# Patient Record
Sex: Male | Born: 1955 | Race: White | Hispanic: No | Marital: Married | Smoking: Former smoker
Health system: Southern US, Community
[De-identification: ages and names within clinical notes are randomized; demographics above are authoritative.]

## PROBLEM LIST (undated history)

## (undated) DIAGNOSIS — A419 Sepsis, unspecified organism: Secondary | ICD-10-CM

## (undated) DIAGNOSIS — K750 Abscess of liver: Secondary | ICD-10-CM

## (undated) DIAGNOSIS — N179 Acute kidney failure, unspecified: Secondary | ICD-10-CM

## (undated) DIAGNOSIS — I82409 Acute embolism and thrombosis of unspecified deep veins of unspecified lower extremity: Secondary | ICD-10-CM

## (undated) DIAGNOSIS — I10 Essential (primary) hypertension: Secondary | ICD-10-CM

## (undated) DIAGNOSIS — I429 Cardiomyopathy, unspecified: Secondary | ICD-10-CM

## (undated) DIAGNOSIS — I3139 Other pericardial effusion (noninflammatory): Secondary | ICD-10-CM

## (undated) DIAGNOSIS — M069 Rheumatoid arthritis, unspecified: Secondary | ICD-10-CM

## (undated) DIAGNOSIS — K5792 Diverticulitis of intestine, part unspecified, without perforation or abscess without bleeding: Secondary | ICD-10-CM

## (undated) DIAGNOSIS — R6521 Severe sepsis with septic shock: Secondary | ICD-10-CM

## (undated) DIAGNOSIS — I313 Pericardial effusion (noninflammatory): Secondary | ICD-10-CM

## (undated) DIAGNOSIS — I472 Ventricular tachycardia: Secondary | ICD-10-CM

## (undated) DIAGNOSIS — E291 Testicular hypofunction: Secondary | ICD-10-CM

## (undated) HISTORY — DX: Rheumatoid arthritis, unspecified: M06.9

## (undated) HISTORY — DX: Other pericardial effusion (noninflammatory): I31.39

## (undated) HISTORY — PX: PERICARDIAL WINDOW: SHX2213

## (undated) HISTORY — DX: Pericardial effusion (noninflammatory): I31.3

## (undated) HISTORY — DX: Acute embolism and thrombosis of unspecified deep veins of unspecified lower extremity: I82.409

## (undated) HISTORY — DX: Diverticulitis of intestine, part unspecified, without perforation or abscess without bleeding: K57.92

## (undated) HISTORY — DX: Acute kidney failure, unspecified: N17.9

## (undated) HISTORY — DX: Cardiomyopathy, unspecified: I42.9

## (undated) HISTORY — DX: Sepsis, unspecified organism: A41.9

## (undated) HISTORY — DX: Ventricular tachycardia: I47.2

## (undated) HISTORY — DX: Essential (primary) hypertension: I10

## (undated) HISTORY — DX: Abscess of liver: K75.0

## (undated) HISTORY — PX: CHOLECYSTECTOMY: SHX55

## (undated) HISTORY — DX: Testicular hypofunction: E29.1

## (undated) HISTORY — DX: Severe sepsis with septic shock: R65.21

## (undated) HISTORY — PX: KNEE SURGERY: SHX244

---

## 2007-10-01 DIAGNOSIS — A419 Sepsis, unspecified organism: Secondary | ICD-10-CM

## 2007-10-01 HISTORY — DX: Sepsis, unspecified organism: A41.9

## 2007-12-12 ENCOUNTER — Ambulatory Visit: Payer: Self-pay | Admitting: Pulmonary Disease

## 2007-12-12 ENCOUNTER — Ambulatory Visit: Payer: Self-pay | Admitting: *Deleted

## 2007-12-12 ENCOUNTER — Inpatient Hospital Stay (HOSPITAL_COMMUNITY): Admission: EM | Admit: 2007-12-12 | Discharge: 2007-12-22 | Payer: Self-pay | Admitting: Internal Medicine

## 2007-12-14 ENCOUNTER — Encounter: Payer: Self-pay | Admitting: Pulmonary Disease

## 2007-12-17 ENCOUNTER — Ambulatory Visit: Payer: Self-pay | Admitting: Infectious Diseases

## 2007-12-18 ENCOUNTER — Ambulatory Visit: Payer: Self-pay | Admitting: Vascular Surgery

## 2007-12-18 ENCOUNTER — Encounter: Payer: Self-pay | Admitting: Pulmonary Disease

## 2007-12-19 ENCOUNTER — Encounter: Payer: Self-pay | Admitting: Pulmonary Disease

## 2007-12-21 ENCOUNTER — Encounter (INDEPENDENT_AMBULATORY_CARE_PROVIDER_SITE_OTHER): Payer: Self-pay | Admitting: Gastroenterology

## 2007-12-23 ENCOUNTER — Encounter (INDEPENDENT_AMBULATORY_CARE_PROVIDER_SITE_OTHER): Payer: Self-pay | Admitting: Internal Medicine

## 2007-12-24 ENCOUNTER — Telehealth (INDEPENDENT_AMBULATORY_CARE_PROVIDER_SITE_OTHER): Payer: Self-pay | Admitting: *Deleted

## 2007-12-30 ENCOUNTER — Encounter: Admission: RE | Admit: 2007-12-30 | Discharge: 2007-12-30 | Payer: Self-pay | Admitting: Interventional Radiology

## 2008-01-06 ENCOUNTER — Encounter: Admission: RE | Admit: 2008-01-06 | Discharge: 2008-01-06 | Payer: Self-pay | Admitting: Interventional Radiology

## 2008-01-11 ENCOUNTER — Encounter: Payer: Self-pay | Admitting: Adult Health

## 2008-01-12 ENCOUNTER — Telehealth (INDEPENDENT_AMBULATORY_CARE_PROVIDER_SITE_OTHER): Payer: Self-pay | Admitting: *Deleted

## 2008-01-19 ENCOUNTER — Ambulatory Visit: Payer: Self-pay | Admitting: Internal Medicine

## 2008-01-19 DIAGNOSIS — M069 Rheumatoid arthritis, unspecified: Secondary | ICD-10-CM | POA: Insufficient documentation

## 2008-01-19 DIAGNOSIS — M129 Arthropathy, unspecified: Secondary | ICD-10-CM | POA: Insufficient documentation

## 2008-01-19 DIAGNOSIS — K750 Abscess of liver: Secondary | ICD-10-CM | POA: Insufficient documentation

## 2008-01-19 DIAGNOSIS — I429 Cardiomyopathy, unspecified: Secondary | ICD-10-CM

## 2008-01-19 DIAGNOSIS — R578 Other shock: Secondary | ICD-10-CM | POA: Insufficient documentation

## 2008-01-19 DIAGNOSIS — I3139 Other pericardial effusion (noninflammatory): Secondary | ICD-10-CM | POA: Insufficient documentation

## 2008-01-19 DIAGNOSIS — N179 Acute kidney failure, unspecified: Secondary | ICD-10-CM

## 2008-01-19 DIAGNOSIS — I313 Pericardial effusion (noninflammatory): Secondary | ICD-10-CM | POA: Insufficient documentation

## 2008-01-19 HISTORY — DX: Cardiomyopathy, unspecified: I42.9

## 2008-02-08 ENCOUNTER — Ambulatory Visit: Payer: Self-pay | Admitting: Surgery

## 2008-03-07 ENCOUNTER — Ambulatory Visit: Payer: Self-pay | Admitting: Pulmonary Disease

## 2008-03-07 DIAGNOSIS — I80299 Phlebitis and thrombophlebitis of other deep vessels of unspecified lower extremity: Secondary | ICD-10-CM

## 2008-03-14 ENCOUNTER — Ambulatory Visit: Payer: Self-pay | Admitting: Surgery

## 2008-04-07 ENCOUNTER — Encounter: Payer: Self-pay | Admitting: Internal Medicine

## 2008-09-25 IMAGING — RF DG SINUS / FISTULA TRACT / ABSCESSOGRAM
1 series · 1 of 1 positions shown · non-contrast
Comparison: 12/21/2007

CLINICAL DATA: SINUS TRACT INJECTION/FISTULOGRAM
TECHNIQUE: Contrast was injected into the hepatic abscess drain.
Images were obtained.. A total of fivecc of Vmnipaque-[id number]
slowly injected. Spot film images were obtained.

[Series 1: run · 1 of 1 slices shown]
[im 1/1]
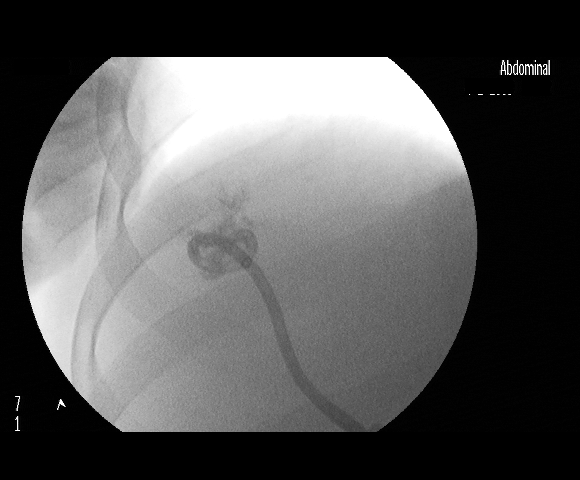

[1 of 1 positions shown; findings below may reference images not displayed]

FINDINGS: The abscess cavity has significantly improved.  A small
cavity remains.  There is no evidence of contrast filling the
biliary tree.

Fluoroscopy Time: 0.5 minutes
IMPRESSION: Improved abscess in the liver.  The patient was instructed to
continue flush injections and to follow-up and 1 week for possible
removal.

## 2010-04-19 ENCOUNTER — Telehealth (INDEPENDENT_AMBULATORY_CARE_PROVIDER_SITE_OTHER): Payer: Self-pay | Admitting: *Deleted

## 2010-04-19 ENCOUNTER — Encounter: Payer: Self-pay | Admitting: Pulmonary Disease

## 2010-10-30 NOTE — Progress Notes (Signed)
Summary: talk to nurse  Phone Note From Other Clinic Call back at [phone]   Caller: [facility] Call For: parrett Summary of Call: Need to speak to nurse in ref to treatment in 2009. Initial call taken by: Darletta Moll,  April 19, 2010 11:01 AM  Follow-up for Phone Call        called spoke with [facility].  she states that they received orders from our office on patient in 2009, but they have yet to be signed to pay for services rendered in 2009 - note: these are not new orders.  she states that the orders have been sent to Dr. Gwendolyn Grant attn, for which i informed her that he is a hosp doc, therefore does not sign orders.  lucky will send orders to triage fax # to be signed by RA.  fax order back [phone] attn [facility] Follow-up by: Boone Master CNA/MA,  April 19, 2010 11:33 AM  Additional Follow-up for Phone Call Additional follow up Details #1::        fax received.  handed to to [facility] for RA to sign.   Boone Master CNA/MA  April 19, 2010 11:43 AM   orders signed by RA and faxed back to [facility] to [facility]'s attn.  placed in RA's scan folder to be scanned into EMR. Boone Master CNA/MA  April 19, 2010 11:57 AM

## 2010-10-30 NOTE — Miscellaneous (Signed)
Summary: Orders/[facility]  Orders/[facility]   Imported By: Lester Blanchard 04/23/2010 07:57:22  _____________________________________________________________________  External Attachment:    Type:   Image     Comment:   External Document

## 2011-02-12 NOTE — Consult Note (Signed)
Harry Marsh, Harry Marsh NO.:  [id number]   MEDICAL RECORD NO.:  [id number]          PATIENT TYPE:  INP   LOCATION:  2109                         FACILITY:  [facility]   PHYSICIAN:  Rod Holler, MD     DATE OF BIRTH:  June 30, 1956   DATE OF CONSULTATION:  12/12/2007  DATE OF DISCHARGE:                                 CONSULTATION   REFERRING PHYSICIAN:  Larina Earthly, MD.   REASON FOR CONSULTATION:  History of cardiac tamponade status post  pericardial window.   HISTORY:  Mr. Harry Marsh is a pleasant 55 year old male with a history of  rheumatoid arthritis on chronic steroids, history of pericardial  effusion and tamponade status post pericardial window 10 years ago who  presented to an outside hospital with complaints of flu-like symptoms.  Over the past couple days, the patient has had flu-like symptoms, chills  and rigors, nausea and vomiting over the past couple days.  He also has  complaints of a cough productive of white sputum.  He also complains of  shortness of breath but has had no chest pain.  His wife has also been  sick at home.   PAST MEDICAL HISTORY:  1. History of pericardial effusion and tamponade status post      pericardial window.  2. Arthritis, on chronic steroids.  3. Status post cholecystectomy.   HOME MEDICINES:  1. Methotrexate.  2. Prednisone.  3. Hydroxychloroquine.  4. Naprosyn.   ALLERGIES:  No known drug allergies.   SOCIAL HISTORY:  The patient is married and denies tobacco use.   FAMILY HISTORY:  Noncontributory.   REVIEW OF SYSTEMS:  All systems are reviewed and are negative except  noted in the History of Present Illness.   PHYSICAL EXAM:  VITAL SIGNS:  Temperature 98.4, heart rate in the one-  teens, blood pressure in the 90s/60s.  GENERAL:  An obese male, alert and oriented x3, diaphoretic, ill-  appearing.  HEENT:  Atraumatic, normocephalic, pupils equal, round and react to  light, extraocular movements intact.  NECK:   Supple, right IJ central line in place, thick neck, no JVD noted  on the left side.  CHEST:  Lungs are clear to auscultation bilaterally with equal bilateral  breath sounds anteriorly.  CORONARY:  Tachycardic, regular, no murmurs, rubs or gallops.  ABDOMEN:  Soft, diffusely tender to palpation, no rebound or guarding.  EXTREMITIES:  No clubbing, cyanosis or edema, warm to the touch.  NEUROLOGIC:  No focal deficits.   Electrocardiogram shows sinus tachycardia, PVC, PAC, no ischemic  changes, poor quality tracing.   LABS:  Sodium 142, potassium 3.3, chloride 103, bicarb 21, BUN 41,  creatinine 2.6, glucose 62, white blood cell count 3.9, hematocrit 47,  platelet count 67,000, INR 1.8.  Albumin 2.8, alk phos 284, amylase 26,  lipase 25, PTT 33, BNP 203, calcium 8.7, CK of 48, CK MB of 1.2,  troponin 0.14.  SGOT 67, SGPT 43, total bilirubin 3.5.   IMPRESSION:  Mr. Harry Marsh is a 55 year old male with a history of  pericardial effusion and tamponade status post pericardial window,  arthritis on  chronic steroids who presents from an outside hospital with  a viral syndrome.  The patient is critically ill, not now hypotensive on  pressors.  Lab values are notable for acute renal failure, platelet  count of 67,000, INR 1.8, elevated total bilirubin.  Also, the patient  has a BNP of 203.  Bedside echocardiogram performed by me showed global  hypokinesis, at least moderately and probably severe reduced ejection  fraction.  There was no significant pericardial effusion.   RECOMMENDATIONS:  1. Formal echocardiogram.  2. Daily electrocardiogram.  3. Agree with current hemodynamic support and treatment of systemic      inflammatory response syndrome.  4. Serial cardiac enzymes.  5. Will continue to follow.      Rod Holler, MD  Electronically Signed     TRK/MEDQ  D:  12/12/2007  T:  12/13/2007  Job:  [id number]

## 2011-02-12 NOTE — Procedures (Signed)
DUPLEX DEEP VENOUS EXAM - LOWER EXTREMITY   INDICATION:  Left leg edema.   HISTORY:  Edema:  Left leg when standing for long periods of time.  Trauma/Surgery:  Patient has pins in the left leg from surgery which was  performed in either 1995 or 1996, patient is unclear which.  Pain:  Bilateral leg pain.  PE:  No.  Previous DVT:  Patient had a right leg gastrocnemius thrombus according  to a limited duplex study performed at [facility] on December 21, 2007.  Anticoagulants:  Other:  On March 14th, the left great toe became ischemic.   DUPLEX EXAM:                CFV   SFV   PopV  PTV    GSV                R  L  R  L  R  L  R   L  R  L  Thrombosis    0  0  0  P  0  P  0   0  0  0  Spontaneous   +  +  +  +  +  +  +   +  +  +  Phasic        +  +  +  +  +  +  +   +  +  +  Augmentation  +  +  +  +  +  +  +   +  +  +  Compressible  +  +  +  P  +  P  +   +  +  +  Competent     +  0  +  0  +  0  +   +  +  0   Legend:  + - yes  o - no  p - partial  D - decreased   IMPRESSION:  1. No evidence of right leg deep or superficial vein thrombus or      Baker's cyst.  2. Subacute thrombus is seen extending from the distal third of the      left superficial femoral vein through the left popliteal, left      tibioperoneal trunk and terminating in the gastrocnemius vein.      This thrombus does not completely occlude the veins.  3. The left common femoral vein, superficial femoral vein and      popliteal veins are incompetent.    _____________________________  V. Charlena Cross, MD   MC/MEDQ  D:  02/08/2008  T:  02/08/2008  Job:  [id number]

## 2011-02-12 NOTE — Discharge Summary (Signed)
Harry Marsh, Harry Marsh NO.:  [id number]   MEDICAL RECORD NO.:  [id number]          PATIENT TYPE:  INP   LOCATION:  3004                         FACILITY:  [facility]   PHYSICIAN:  Harry Bucks, MD DATE OF BIRTH:  04-01-1956   DATE OF ADMISSION:  12/12/2007  DATE OF DISCHARGE:  12/22/2007                               DISCHARGE SUMMARY   FINAL DIAGNOSES:  1. Liver abscess.  2. Resolving acute renal failure secondary to acute tubular necrosis.  3. Cardiomyopathy.  4. Resolved septic shock.  5. Rheumatoid arthritis.   CONSULTING PHYSICIANS:  1. Harry Marsh. Harry Marsh, M.D. with Infectious Disease.  2. Harry Marsh, M.D. with Interventional Radiology.  3. Harry Marsh, M.D. with [facility] Cardiology.   PROCEDURES:  1. Right internal jugular vein triple lumen catheter placed on December 12, 2007, removed on December 21, 2007.  2. Right upper extremity PICC line placed on December 21, 2007 for the      purpose of IV antibiotic infusion.   LABORATORY DATA:  Microbiology.  Anaerobic culture obtained from liver  abscess on December 13, 2007, demonstrating abundant Bacteroides vulgatus  this was beta-lactamase positive.  Blood cultures on December 12, 2007  negative.  Blood cultures on December 17, 2007 negative preliminary to  date.  Abscess culture on December 13, 2007 demonstrating few  microaerophilic streptococci.   BRIEF HISTORY:  This is a 55 year old male patient with a history of  rheumatoid arthritis on steroids and methotrexate.  He presented with  flu-like symptoms, nausea, vomiting, and hypotension.  He was  transferred from [facility] hypotensive on a dopamine infusion.  He had no significant sick contacts.  He was transferred to [facility] for definitive care.   PAST MEDICAL HISTORY:  Consistent with rheumatoid arthritis for which he  is followed by Harry Marsh for in [place] and is steroid dependent.  Additionally, he had been treated with  prednisone, methotrexate, and  hydroxychloroquine.  He has no known drug allergies.  He is a nonsmoker  and no IV drug abuse.   HOSPITAL COURSE BY DISCHARGE DIAGNOSES:  Severe sepsis, septic shock  secondary to liver abscess, complicated by relative adrenal  insufficiency.  Harry Marsh was admitted to the intensive care at [facility].  A CT of the abdomen was obtained that demonstrated a  complex liver lesion concerning for abscess.  CT findings as mentioned  above.  This was felt to be a potential source for infection therefore,  it was decided to consult Interventional Radiology for CT-guided abscess  drainage.  This was performed on that same day.  Harry Marsh was  continued to support with aggressive empiric antibiotics, IV fluid  resuscitation, stress dose steroids, and IV vasopressors.  He was  eventually weaned off IV dopamine infusion and continued on antibiotics.  Culture data as noted above.  He has continued to have a liver drain in  place.  The size of the liver abscess has shrunk from approximately 6.4  cm down to approximately 3 cm in size following drainage.  He  will be  followed in the outpatient setting for further evaluation, repeat CT  scans and evaluation for removal of liver abscess drain.  From  antimicrobial standpoint, he was initially treated on empiric Zosyn.  This was continued secondary to concern for anaerobic coverage.  Culture  data came back and was resulted as above.  Because of this, Infectious  Disease was consulted.  Harry Marsh saw the patient in  consultation, recommended continue IV Zosyn while inpatient setting,  then complete another 21 days of ertapenem 1 g IV daily.  Because of  this a PICC line was placed in anticipation of prolonged IV access  needs.   PLAN:  1. Upon time of discharge, Harry Marsh is hemodynamically stable, his      vital signs are stable.  He is afebrile and white blood cell count      is trending down.   2. Known cardiomyopathy.  This was likely exacerbated by sepsis.  He      was followed and evaluated by the [facility] Pulmonary Service, and he      will have further followup in the outpatient setting.  Upon time of      discharge, he will be discharged to home on a regimen of Lasix,      Imdur, Coreg, and Apresoline with follow up by the [facility] Critical      Care Service.  3. Resolving acute renal failure, secondary to acute tubular necrosis      in the setting of shock.  This as mentioned is resolving.  His      serum creatinine upon time of discharge is 1.44.  Of note, serum      chemistry on day prior to discharge notes sodium of 139, potassium      at 3.7, chloride of 103, CO2 of 27, glucose of 147, BUN of 25, and      creatinine of 1.44.  4. History of peripheral vascular disease.  This will be followed in      the outpatient setting.  5. Rheumatoid arthritis.  The patient currently because of a life-      threatening infection has been held off from his immunosuppressing      agents.  It has been recommended that he sees his primary      rheumatologist in the next 7-10 days for further evaluation.   DISCHARGE INSTRUCTIONS:  Liver abscess flush.  To flush 10 mL of saline  1-2 times a day and record daily output.  Followup with Interventional  Radiology at [facility] imaging, Harry Marsh __________, on Wednesday December 30, 2007.  Also, he is going to see nurse practitioner Harry Marsh on  January 19, 2008 for consideration for discontinuing PICC line.  He has  asked [place] to assist with finding him a new primary care Harry Marsh.  We  have done this and he will be referred to Harry Marsh, April 07, 2008  at 2:20 p.m.   DISCHARGE MEDICATIONS:  1. Prednisone 5 mg daily.  2. Lasix 40 mg daily.  3. K-Dur 20 mEq daily.  4. Imdur 30 mg daily.  5. Coreg 3.125 mg 1 tablet twice a day.  6. Apresoline 10 mg tab every 8 hours.  7. Percocet 1 every 6 hours as needed for pain.  8. Ertapenem 1 g  daily IV x21 days.      Harry Resides, NP      Harry Bucks, MD  Electronically Signed    PB/MEDQ  D:  12/22/2007  T:  12/23/2007  Job:  [id number]   cc:   Lemmie Evens, M.D.  Harry Oaks, NP  Rosalyn Gess. Norins, MD  Harry Marsh

## 2011-02-12 NOTE — Assessment & Plan Note (Signed)
OFFICE VISIT   Harry Marsh, Harry Marsh  DOB:  01/28/1956                                       02/08/2008  ZOXWR#:[id number]   DICTATION MISSING  Left toe ulcer.   HISTORY:  This is a 55 year old gentleman who was recently discharged  from the hospital on March 24th, 2009 after suffering from a liver  abscess.  He had acute renal failure as well as a septic shock.  During  his hospitalization, he developed ischemic changes to his left third  toe.  This is not causing him significant amount of pain.  He is not  having any drainage or odor from it.  He is currently placing Silvadene  on it as a dressing.   REVIEW OF SYSTEMS:  Is negative for fevers, chills, weight gain, weight  loss.  CARDIAC:  Is positive for occasional chest pain.  PULMONARY:  Negative.  GI:  Negative.  GU:  Is burning with urination and frequency urination.  VASCULAR:  Pain in the legs with walking and lying flat.  NEURO:  Is positive for dizziness.  ORTHO:  Positive for rheumatoid arthritis.  PSYCH:  Is negative.  ENT:  Negative.  HEME:  Negative.   PAST MEDICAL HISTORY:  History of congestive heart failure, pericardial  tamponade, liver abscess, rheumatoid arthritis.   FAMILY HISTORY:  Negative for cardiovascular disease.   SOCIAL HISTORY:  Married.  Works as an Chief Strategy Officer.  Does not  smoke.  Has a history of smoking but quit in 1999.  Does not drink  alcohol.   MEDICATIONS:  Include prednisone 5 mg per day, methotrexate 2.5 mg,  Coreg 3.125 twice a day, Apresoline 10 mg every 8 hours, Lasix 40 mg per  day, Imdur 30 mg per day, Bactrim 2 pills daily, Augmentin 875 twice a  day, multivitamin, B complex vitamin, folic acid, calcium.   ALLERGIES:  None.   PHYSICAL EXAMINATION:  Heart rate is 78, blood pressure is 129/84.  General:  Well-appearing, no acute distress.  His left leg shows a mild  edema.  There is a dry gangrene of the distal half of his left third  toe.   There is no evidence of infection.     The patient has palpable pedal pulses.   DIAGNOSTIC STUDIES:  Were performed today, this reveals normal ankle  brachial indices bilaterally.  There is a thrombus of chronic duration  in the distal third of the left superficial femoral vein through the  left popliteal and left tibioperoneal trunk terminating into the  gastrocnemius vein on the left.   ASSESSMENT/PLAN:  1. Ischemic left third toe.  I presented the patient 2 options, either      autoamputation versus surgical amputation.  At this point, the      patient wishes to continue with expectant management.  He will      continue with Silvadene dressings once a day.  I told him if it      gets more painful or becomes infected, has a bad odor, he should      call me we would need to proceed with a different option.  2. 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  He may benefit in the future from compression stockings      to help with edema.  I do not feel that he needs to be      anticoagulated at this time as this appears to be a chronic      process.   Jorge Ny, MD  Electronically Signed   VWB/MEDQ  D:  02/08/2008  T:  02/09/2008  Job:  [id number]   cc:   Channing Mutters Dr. Burman Freestone

## 2011-02-12 NOTE — Procedures (Signed)
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   HISTORY:  Edema:  Left leg edema.  Trauma/Surgery:  Patient had left leg surgery over 10 years ago.  Pain:  Patient reports bilateral lower extremity pain.  PE:  No.  Previous DVT:  Patient had chronic thrombus seen in the left superficial  femoral popliteal veins on 02/08/08.  Anticoagulants:  No.  Other:  No.   DUPLEX EXAM:                CFV   SFV   PopV   PTV   GSV                R  L  R  L  R   L  R  L  R  L  Thrombosis    O  o     o      o     o     o  Spontaneous   +  +     +      +     +     +  Phasic        +  +     +      +     +     +  Augmentation  +  +     +      +     +     +  Compressible  +  +     +      +     +     +  Competent     +  O     O      O     +     O   Legend:  + - yes  o - no  p - partial  D - decreased   IMPRESSION:  1. Previously documented left leg deep venous thrombosis appears to be      resolved.  2. Deep venous incompetence is seen throughout the left leg.  3. No evidence of deep or superficial venous thrombus or baker's cyst      in the left leg.    _____________________________  V. Charlena Cross, MD   MC/MEDQ  D:  03/14/2008  T:  03/14/2008  Job:  [id number]

## 2011-02-12 NOTE — Assessment & Plan Note (Signed)
OFFICE VISIT   TREBOR, GALDAMEZ  DOB:  [birth year]                                       03/14/2008  UEAVW#:[id number]   REASON FOR VISIT:  Followup left toe ulcer.   HISTORY:  This is a 55 year old gentleman who was discharged from the  hospital in March after suffering a liver abscess.  This was complicated  by acute renal failure as well as septic shock.  During that  hospitalization, he developed ischemic changes in his left third toe.  When I saw him, he was not having any pain or drainage and we elected to  follow this in anticipation of autoamputation.  At that time he was also  found to have chronic/subacute DVT in the left leg.  He comes back in  today for followup.  He says he is having some swelling in his left leg.  The toe is healing.   PHYSICAL EXAMINATION:  Blood pressures 130/89, pulse 69, respirations  18.  General:  Well-appearing, no acute distress.  The left leg has  hyperpigmentation in the medial aspect in the gaiter region.  There is  no ulceration.  The fifth toe has evidence of granulation.  There is 1  small and 1 mm area of eschar.  There is no evidence of infection.   ASSESSMENT/PLAN:  Toe ulcer and deep vein thrombosis.   PLAN:  We will repeat his duplex ultrasound to make sure his DVT has not  changed.  At this point, I would only recommend compression to help with  the swelling.  We talked about the long term implications of chronic  swelling including ulcers.  I have recommended that he go into chronic  compression therapy.   With regards to his toe, I feel like this is healing and will most  likely not need surgical intervention.  He is being followed for this by  another physician, and I think it is healing nicely.  The patient will  follow up with me on a p.r.n. basis.   Jorge Ny, MD  Electronically Signed   VWB/MEDQ  D:  03/14/2008  T:  03/15/2008  Job:  [id number]   cc:   Dr. Yolanda Bonine

## 2011-06-24 LAB — PREPARE FRESH FROZEN PLASMA

## 2011-06-24 LAB — LEGIONELLA ANTIGEN, URINE: Legionella Antigen, Urine: NEGATIVE

## 2011-06-24 LAB — CBC
HCT: 40.6
HCT: 36.5 — ABNORMAL LOW
HCT: 39.5
HCT: 40.4
HCT: 41.1
HCT: 43
Hemoglobin: 12.1 — ABNORMAL LOW
Hemoglobin: 14.1
MCHC: 34.4
MCHC: 33.8
MCV: 94.2
MCV: 93.2
MCV: 94.4
MCV: 94.6
Platelets: 67 — ABNORMAL LOW
Platelets: 100 — ABNORMAL LOW
Platelets: 135 — ABNORMAL LOW
Platelets: 211
Platelets: 67 — ABNORMAL LOW
Platelets: 71 — ABNORMAL LOW
RBC: 4.31
RBC: 3.82 — ABNORMAL LOW
RBC: 4.36
RDW: 13.9
RDW: 13.9
RDW: 14
RDW: 14.2
RDW: 14.3
WBC: 19.3 — ABNORMAL HIGH
WBC: 15.3 — ABNORMAL HIGH
WBC: 17.8 — ABNORMAL HIGH
WBC: 21.3 — ABNORMAL HIGH
WBC: 31 — ABNORMAL HIGH

## 2011-06-24 LAB — BASIC METABOLIC PANEL
BUN: 42 — ABNORMAL HIGH
BUN: 32 — ABNORMAL HIGH
BUN: 46 — ABNORMAL HIGH
BUN: 46 — ABNORMAL HIGH
BUN: 62 — ABNORMAL HIGH
CO2: 16 — ABNORMAL LOW
CO2: 30
Calcium: 7 — ABNORMAL LOW
Calcium: 6.6 — ABNORMAL LOW
Calcium: 7.9 — ABNORMAL LOW
Chloride: 110
Chloride: 103
Chloride: 107
Creatinine, Ser: 2.59 — ABNORMAL HIGH
Creatinine, Ser: 2.23 — ABNORMAL HIGH
Creatinine, Ser: 2.96 — ABNORMAL HIGH
Creatinine, Ser: 3.94 — ABNORMAL HIGH
GFR calc Af Amer: 32 — ABNORMAL LOW
GFR calc Af Amer: 20 — ABNORMAL LOW
GFR calc Af Amer: >60
GFR calc non Af Amer: 16 — ABNORMAL LOW
GFR calc non Af Amer: 16 — ABNORMAL LOW
GFR calc non Af Amer: 31 — ABNORMAL LOW
GFR calc non Af Amer: 50 — ABNORMAL LOW
Glucose, Bld: 128 — ABNORMAL HIGH
Glucose, Bld: 141 — ABNORMAL HIGH
Glucose, Bld: 193 — ABNORMAL HIGH
Glucose, Bld: 218 — ABNORMAL HIGH
Potassium: 3.2 — ABNORMAL LOW
Potassium: 3.7
Potassium: 4.5
Potassium: 4.6
Potassium: 4.6
Sodium: 137
Sodium: 137

## 2011-06-24 LAB — COMPREHENSIVE METABOLIC PANEL
ALT: 293 — ABNORMAL HIGH
AST: 139 — ABNORMAL HIGH
AST: 352 — ABNORMAL HIGH
AST: 444 — ABNORMAL HIGH
Albumin: 2 — ABNORMAL LOW
Albumin: 2.1 — ABNORMAL LOW
Alkaline Phosphatase: 124 — ABNORMAL HIGH
BUN: 68 — ABNORMAL HIGH
BUN: 74 — ABNORMAL HIGH
BUN: 79 — ABNORMAL HIGH
CO2: 18 — ABNORMAL LOW
Calcium: 6.5 — ABNORMAL LOW
Chloride: 101
Chloride: 103
Creatinine, Ser: 2.88 — ABNORMAL HIGH
Creatinine, Ser: 3.73 — ABNORMAL HIGH
GFR calc Af Amer: 21 — ABNORMAL LOW
GFR calc Af Amer: 22 — ABNORMAL LOW
GFR calc Af Amer: 28 — ABNORMAL LOW
GFR calc non Af Amer: 17 — ABNORMAL LOW
Potassium: 3.1 — ABNORMAL LOW
Potassium: 3.3 — ABNORMAL LOW
Sodium: 138
Total Bilirubin: 2.3 — ABNORMAL HIGH
Total Bilirubin: 3.9 — ABNORMAL HIGH
Total Protein: 5.6 — ABNORMAL LOW
Total Protein: 5.7 — ABNORMAL LOW

## 2011-06-24 LAB — MAGNESIUM
Magnesium: 1.8
Magnesium: 1.8

## 2011-06-24 LAB — POCT I-STAT 3, ART BLOOD GAS (G3+)
Acid-base deficit: 10 — ABNORMAL HIGH
Acid-base deficit: 2
Bicarbonate: 13.1 — ABNORMAL LOW
Bicarbonate: 20.9
O2 Saturation: 99
Operator id: 138981
Operator id: 285131
Patient temperature: 97.7
Patient temperature: 98.4
Patient temperature: 98.7
TCO2: 14
TCO2: 22
pCO2 arterial: 21.1 — ABNORMAL LOW
pH, Arterial: 7.301 — ABNORMAL LOW
pH, Arterial: 7.464 — ABNORMAL HIGH
pO2, Arterial: 90

## 2011-06-24 LAB — CARDIAC PANEL(CRET KIN+CKTOT+MB+TROPI)
CK, MB: 3.8
CK, MB: 5.5 — ABNORMAL HIGH
Relative Index: 2.6 — ABNORMAL HIGH
Relative Index: INVALID
Total CK: 57
Total CK: 70
Troponin I: 0.08 — ABNORMAL HIGH
Troponin I: 0.23 — ABNORMAL HIGH
Troponin I: 0.4 — ABNORMAL HIGH
Troponin I: 0.44 — ABNORMAL HIGH

## 2011-06-24 LAB — URINALYSIS, ROUTINE W REFLEX MICROSCOPIC
Hgb urine dipstick: NEGATIVE
Ketones, ur: NEGATIVE
Nitrite: NEGATIVE
Protein, ur: 100 — AB
Urobilinogen, UA: 1

## 2011-06-24 LAB — FUNGUS CULTURE W SMEAR: Fungal Smear: NONE SEEN

## 2011-06-24 LAB — LIPASE, BLOOD: Lipase: 13

## 2011-06-24 LAB — CULTURE, BLOOD (ROUTINE X 2)
Culture: NO GROWTH
Culture: NO GROWTH

## 2011-06-24 LAB — URINE CULTURE
Colony Count: NO GROWTH
Special Requests: NEGATIVE

## 2011-06-24 LAB — CARBOXYHEMOGLOBIN
Carboxyhemoglobin: 0.7
Methemoglobin: 0.9
O2 Saturation: 72.6
Total hemoglobin: 14.4

## 2011-06-24 LAB — DIC (DISSEMINATED INTRAVASCULAR COAGULATION)PANEL
D-Dimer, Quant: >20 — ABNORMAL HIGH
Fibrinogen: 550 — ABNORMAL HIGH
Platelets: 68 — ABNORMAL LOW
aPTT: 24

## 2011-06-24 LAB — PROTIME-INR
INR: 1.6 — ABNORMAL HIGH
Prothrombin Time: 21.1 — ABNORMAL HIGH
Prothrombin Time: 19.4 — ABNORMAL HIGH

## 2011-06-24 LAB — HEPATIC FUNCTION PANEL
ALT: 42
Alkaline Phosphatase: 123 — ABNORMAL HIGH
Bilirubin, Direct: 2.5 — ABNORMAL HIGH
Indirect Bilirubin: 1.3 — ABNORMAL HIGH

## 2011-06-24 LAB — DIFFERENTIAL
Basophils Absolute: 0.1
Eosinophils Relative: 0
Eosinophils Relative: 4
Lymphocytes Relative: 11 — ABNORMAL LOW
Lymphs Abs: 0.6 — ABNORMAL LOW
Monocytes Absolute: 0.4
Monocytes Relative: 2 — ABNORMAL LOW
Neutro Abs: 18.3 — ABNORMAL HIGH
Neutro Abs: 20.1 — ABNORMAL HIGH

## 2011-06-24 LAB — E. HISTOLYTICA ANTIBODY (AMOEBA AB): E histolytica Ab: 0.1 IV

## 2011-06-24 LAB — ANAEROBIC CULTURE

## 2011-06-24 LAB — PHOSPHORUS
Phosphorus: 4.3
Phosphorus: 3.6

## 2011-06-24 LAB — STOOL CULTURE

## 2011-06-24 LAB — CULTURE, ROUTINE-ABSCESS

## 2011-06-24 LAB — APTT: aPTT: 29

## 2011-06-24 LAB — B-NATRIURETIC PEPTIDE (CONVERTED LAB)
Pro B Natriuretic peptide (BNP): 558 — ABNORMAL HIGH
Pro B Natriuretic peptide (BNP): 783 — ABNORMAL HIGH

## 2011-06-24 LAB — PREPARE PLATELET PHERESIS

## 2011-06-24 LAB — ABO/RH: ABO/RH(D): O POS

## 2011-06-24 LAB — URINE MICROSCOPIC-ADD ON

## 2011-06-26 ENCOUNTER — Encounter: Payer: Self-pay | Admitting: Cardiology

## 2011-06-27 ENCOUNTER — Ambulatory Visit (INDEPENDENT_AMBULATORY_CARE_PROVIDER_SITE_OTHER): Payer: BC Managed Care – PPO | Admitting: Cardiology

## 2011-06-27 ENCOUNTER — Encounter: Payer: Self-pay | Admitting: Cardiology

## 2011-06-27 DIAGNOSIS — R0989 Other specified symptoms and signs involving the circulatory and respiratory systems: Secondary | ICD-10-CM

## 2011-06-27 DIAGNOSIS — I319 Disease of pericardium, unspecified: Secondary | ICD-10-CM

## 2011-06-27 DIAGNOSIS — R0600 Dyspnea, unspecified: Secondary | ICD-10-CM | POA: Insufficient documentation

## 2011-06-27 DIAGNOSIS — R52 Pain, unspecified: Secondary | ICD-10-CM

## 2011-06-27 DIAGNOSIS — R079 Chest pain, unspecified: Secondary | ICD-10-CM

## 2011-06-27 DIAGNOSIS — I313 Pericardial effusion (noninflammatory): Secondary | ICD-10-CM

## 2011-06-27 DIAGNOSIS — I428 Other cardiomyopathies: Secondary | ICD-10-CM

## 2011-06-27 MED ORDER — HYDROCODONE-ACETAMINOPHEN 5-500 MG PO TABS: 1.0000 | ORAL_TABLET | Freq: Three times a day (TID) | ORAL | Status: AC | PRN

## 2011-06-27 NOTE — Assessment & Plan Note (Signed)
This will be evaluated over time.

## 2011-06-27 NOTE — Progress Notes (Signed)
HPI The patient presents for evaluation of chest pain and dyspnea. He had a previous history of pericardial effusion with pericardial window in the late 1990s. The etiology of this would is not clear to me. I suspect viral. He also had a dilated cardiomyopathy in 2009 when he was critically ill with sepsis. We saw him at that time during this hospitalization. He has otherwise been followed at [place]. He does report having had a stress test probably in 2010. He believes this was normal but does not recall his ejection fraction.  Recently he has noticed that his blood pressure has been increasing. He has had decreased energy and some decreased exercise tolerance. He has some chest discomfort.  He reports that this is episodic for the most part though recently he has had a more constant dull ache and at its peak it is 8/10 in intensity. It is left-sided. He feels some heaviness. It may be associated with some shortness of breath and a sensation of not getting a deep breath but only on the left side. This happens at rest and is not reproducible necessarily with exertion. However, when he climbs a flight of stairs or lift 50 pounds which he does at work he feels very fatigued. He did have an EKG by his primary physician which demonstrated nonspecific inferior T-wave changes. He denies any PND or orthopnea. He does have some occasional palpitations but no presyncope or syncope. He has had no new edema.  No Known Allergies  Current Outpatient Prescriptions  Medication Sig Dispense Refill  . lisinopril (PRINIVIL,ZESTRIL) 10 MG tablet Take 10 mg by mouth daily.        . methotrexate (RHEUMATREX) 2.5 MG tablet Take 2.5 mg by mouth once a week. Caution:Chemotherapy. Protect from light. Take 8 tablets       . Multiple Vitamin (MULTIVITAMIN) tablet Take 1 tablet by mouth daily.        . Omega-3 Fatty Acids (FISH OIL) 1000 MG CAPS Take 3 capsules by mouth daily.        . predniSONE (DELTASONE) 5 MG tablet Take 5 mg  by mouth daily.        . folic acid (FOLVITE) 400 MCG tablet Take 400 mcg by mouth daily.          Past Medical History  Diagnosis Date  . Arthritis, rheumatoid   . Septic shock     hx of  . Cardiomyopathy   . Renal failure, acute   . Abscess of liver   . Arthritis   . Pericardial effusion     hx of  . DVT (deep venous thrombosis)   . HTN (hypertension)     Past Surgical History  Procedure Date  . Pericardial window   . Knee surgery     Left  . Cholecystectomy     Family History  Problem Relation Age of Onset  . Other      negative for cariovascular disease    History   Social History  . Marital Status: Married    Spouse Name: N/A    Number of Children: 4  . Years of Education: N/A   Occupational History  . Inventory specialist    Social History Main Topics  . Smoking status: Former Smoker    Quit date: 09/30/1996  . Smokeless tobacco: Not on file  . Alcohol Use: Yes  . Drug Use: No  . Sexually Active: Not on file   Other Topics Concern  . Not on file  Social History Narrative  . No narrative on file    ROS:  Positive for occasional headaches and dizziness, back pain, joint pain.  Otherwise as stated in the HPI and negative for all other systems.   PHYSICAL EXAM BP 110/80  Pulse 80  Resp 18  Ht 6\' 1"  (1.854 m)  Wt 263 lb (119.296 kg)  BMI 34.70 kg/m2 GENERAL:  Well appearing HEENT:  Pupils equal round and reactive, fundi not visualized, oral mucosa unremarkable NECK:  No jugular venous distention, waveform within normal limits, carotid upstroke brisk and symmetric, no bruits, no thyromegaly LYMPHATICS:  No cervical, inguinal adenopathy LUNGS:  Clear to auscultation bilaterally BACK:  No CVA tenderness CHEST:  Window scar HEART:  PMI not displaced or sustained,S1 and S2 within normal limits, no S3, no S4, no clicks, no rubs, no murmurs ABD:  Flat, positive bowel sounds normal in frequency in pitch, no bruits, no rebound, no guarding, no  midline pulsatile mass, no hepatomegaly, no splenomegaly EXT:  2 plus pulses throughout, no edema, no cyanosis no clubbing SKIN:  No rashes no nodules NEURO:  Cranial nerves II through XII grossly intact, motor grossly intact throughout [facility]:  Cognitively intact, oriented to person place and time   EKG:  06/13/11  Sinus rhythm, rate 75, axis within normal limits, intervals within normal limits, nonspecific inferolateral T wave flattening.  ASSESSMENT AND PLAN

## 2011-06-27 NOTE — Assessment & Plan Note (Signed)
His chest pain has some typical and some atypical features.  At this point I will check a stress test.  He will not be able to walk on a treadmill he reports because of his significant fatigue and some joint pain.  He will need a [employer].

## 2011-06-27 NOTE — Patient Instructions (Addendum)
Your physician has requested that you have a lexiscan myoview. For further information please visit [email or web address] Please follow instruction sheet, as given.  Your physician has requested that you have an echocardiogram. Echocardiography is a painless test that uses sound waves to create images of your heart. It provides your doctor with information about the size and shape of your heart and how well your heart's chambers and valves are working. This procedure takes approximately one hour. There are no restrictions for this procedure.  The current medical regimen is effective;  continue present plan and medications.  Follow up after testing is completed.

## 2011-06-27 NOTE — Assessment & Plan Note (Signed)
I will evaluate his EF and previous pericardial effusion with an echocardiogram.

## 2011-07-03 ENCOUNTER — Encounter: Payer: Self-pay | Admitting: *Deleted

## 2011-07-10 ENCOUNTER — Ambulatory Visit (HOSPITAL_COMMUNITY): Payer: BC Managed Care – PPO | Attending: Cardiology | Admitting: Radiology

## 2011-07-10 DIAGNOSIS — R0609 Other forms of dyspnea: Secondary | ICD-10-CM | POA: Insufficient documentation

## 2011-07-10 DIAGNOSIS — I079 Rheumatic tricuspid valve disease, unspecified: Secondary | ICD-10-CM | POA: Insufficient documentation

## 2011-07-10 DIAGNOSIS — R0989 Other specified symptoms and signs involving the circulatory and respiratory systems: Secondary | ICD-10-CM | POA: Insufficient documentation

## 2011-07-10 DIAGNOSIS — E669 Obesity, unspecified: Secondary | ICD-10-CM | POA: Insufficient documentation

## 2011-07-10 DIAGNOSIS — Z87891 Personal history of nicotine dependence: Secondary | ICD-10-CM | POA: Insufficient documentation

## 2011-07-10 DIAGNOSIS — I379 Nonrheumatic pulmonary valve disorder, unspecified: Secondary | ICD-10-CM | POA: Insufficient documentation

## 2011-07-10 DIAGNOSIS — I428 Other cardiomyopathies: Secondary | ICD-10-CM | POA: Insufficient documentation

## 2011-07-10 DIAGNOSIS — R079 Chest pain, unspecified: Secondary | ICD-10-CM | POA: Insufficient documentation

## 2011-07-10 DIAGNOSIS — I517 Cardiomegaly: Secondary | ICD-10-CM | POA: Insufficient documentation

## 2011-07-10 DIAGNOSIS — I313 Pericardial effusion (noninflammatory): Secondary | ICD-10-CM

## 2011-07-10 DIAGNOSIS — R9431 Abnormal electrocardiogram [ECG] [EKG]: Secondary | ICD-10-CM

## 2011-07-10 DIAGNOSIS — R0789 Other chest pain: Secondary | ICD-10-CM

## 2011-07-10 MED ORDER — TECHNETIUM TC 99M TETROFOSMIN IV KIT
11.0000 | PACK | Freq: Once | INTRAVENOUS | Status: AC | PRN
  Administered 2011-07-10: 11 via INTRAVENOUS

## 2011-07-10 MED ORDER — REGADENOSON 0.4 MG/5ML IV SOLN
0.4000 mg | Freq: Once | INTRAVENOUS | Status: AC
  Administered 2011-07-10: 0.4 mg via INTRAVENOUS

## 2011-07-10 MED ORDER — TECHNETIUM TC 99M TETROFOSMIN IV KIT
33.0000 | PACK | Freq: Once | INTRAVENOUS | Status: AC | PRN
  Administered 2011-07-10: 33 via INTRAVENOUS

## 2011-07-10 NOTE — Progress Notes (Signed)
[facility] SITE 3 NUCLEAR MED [address] [place] [address] [phone]  Cardiology Nuclear Med Study  Harry Marsh is a 55 y.o. male [id number] 20-Aug-1956   Nuclear Med Background Indication for Stress Test:  Evaluation for Ischemia and Abnormal EKG with  Nonspecific T wave changes History:  '09 Echo: EF=20-25% severe Hypokinesis, and '10 Myocardial Perfusion Study: NL ([facility]) Cardiac Risk Factors: History of Smoking, Hypertension, Lipids and TIA  Symptoms:  Chest tenderness to touch at left breast x couple weeks, and also a Chest Pressure and Chest Tightness with and without Exertion (last date of chest discomfort today) Diaphoresis, Dizziness, DOE, Fatigue, Light-Headedness, Nausea, Palpitations and SOB   Nuclear Pre-Procedure Caffeine/Decaff Intake:  10:00pm NPO After: 5:30am   Lungs:  Clear IV 0.9% NS with Angio Cath:  20g  IV Site: R Hand  IV Started by:  Cathlyn Parsons, RN  Chest Size (in):  54 Cup Size: n/a  Height: 6\' 1"  (1.854 m)  Weight:  267 lb (121.11 kg)  BMI:  Body mass index is 35.23 kg/(m^2). Tech Comments:  n/a    Nuclear Med Study 1 or 2 day study: 1 day  Stress Test Type:  Treadmill/Lexiscan  Reading MD: Marca Ancona, MD  Order Authorizing Provider:  Melany Guernsey  Resting Radionuclide: Technetium 11m Tetrofosmin  Resting Radionuclide Dose: 11 mCi   Stress Radionuclide:  Technetium 60m Tetrofosmin  Stress Radionuclide Dose: 33 mCi           Stress Protocol Rest HR: 62 Stress HR: 100  Rest BP: 100/78 Stress BP: 170/76  Exercise Time (min): 2:00 METS: n/a   Predicted Max HR: 165 bpm % Max HR: 60.61 bpm Rate Pressure Product: 21308   Dose of Adenosine (mg):  n/a Dose of Lexiscan: 0.4 mg  Dose of Atropine (mg): n/a Dose of Dobutamine: n/a mcg/kg/min (at max HR)  Stress Test Technologist: Irean Hong, RN  Nuclear Technologist:  Domenic Polite, CNMT     Rest Procedure:  Myocardial perfusion imaging was performed at  rest 45 minutes following the intravenous administration of Technetium 72m Tetrofosmin. Rest ECG: NSR with T wave changes, PVC,PJC  Stress Procedure:  The patient received IV Lexiscan 0.4 mg over 15-seconds with concurrent low level exercise. Technetium 61m Tetrofosmin was injected at 30-seconds while the patient continued walking.There were no significant changes with Lexiscan.There was a rare PVC.  Quantitative spect images were obtained after a 45-minute delay. Stress ECG: No significant change from baseline ECG  QPS Raw Data Images:  Normal; no motion artifact; normal heart/lung ratio. Stress Images:  Small apical perfusion defect.  Rest Images:  Small apical perfusion defect.  Subtraction (SDS):  Small fixed apical perfusion defect.  Transient Ischemic Dilatation (Normal <1.22):  1.07 Lung/Heart Ratio (Normal <0.45):  .19  Quantitative Gated Spect Images QGS EDV:  125 ml QGS ESV:  61 ml QGS cine images:  Abnormal septal motion.  QGS EF: 51%  Impression Exercise Capacity:  Lexiscan with low level exercise. BP Response:  Normal blood pressure response. Clinical Symptoms:  Chest tight, lightheaded ECG Impression:  No significant ST segment change suggestive of ischemia. Comparison with Prior Nuclear Study: No images to compare  Overall Impression:  Small fixed apical perfusion defect likely represents apical thinning.  No evidence for ischemia or infarction.  EF 51% with abnormal septal motion (though bundle branch block not seen on ECG).  Marca Ancona     .

## 2011-07-15 ENCOUNTER — Encounter: Payer: Self-pay | Admitting: Cardiology

## 2011-08-07 NOTE — Patient Instructions (Signed)
Per Dr Antoine Poche, pt is aware of results

## 2011-08-09 ENCOUNTER — Encounter: Payer: Self-pay | Admitting: Cardiology

## 2016-02-12 ENCOUNTER — Telehealth: Payer: Self-pay | Admitting: Cardiology

## 2016-02-12 NOTE — Telephone Encounter (Signed)
New message    Pt c/o of Chest Pain: STAT if CP now or developed within 24 hours  1. Are you having CP right now? no  2. Are you experiencing any other symptoms (ex. SOB, nausea, vomiting, sweating)? Per Mindi Junker not certain  3. How long have you been experiencing CP? While pt was there for visit  4. Is your CP continuous or coming and going? Coming and going  5. Have you taken Nitroglycerin? uncertain   Per Mindi Junker she is faxing EKG and notes on the pt no-one needs to call her back this is let the Md be aware of the pt is being put on blood pressure medications Losartan.

## 2016-02-13 ENCOUNTER — Telehealth: Payer: Self-pay | Admitting: *Deleted

## 2016-02-13 NOTE — Telephone Encounter (Signed)
EKG came in thru faxed for Dr Antoine Poche to reviewed "there was new EKG changes" noted by Dr Antoine Poche and he want pt to be soon in clinic next available on flex schedule. Appt was made for May 23rd @ 8:00 am at [facility], pt is aware of appt time and date.

## 2016-02-13 NOTE — Telephone Encounter (Signed)
EKG given to Harry Marsh to be send over to [facility] for appt on May 23rd.

## 2016-02-19 NOTE — Progress Notes (Signed)
Cardiology Office Note:    Date:  02/20/2016   ID:  Harry Marsh, DOB 09/24/1956, MRN [id number]  PCP:  [facility] in [place], [place] Harry Marsh, [place])  Cardiologist:  New - saw Dr. Rollene Rotunda in 2012 Electrophysiologist:  N/a Rheumatologist: Dr. Dierdre Forth  Referring MD: Exie Parody, MD   Chief Complaint  Patient presents with  . Chest Pain    History of Present Illness:     Harry Marsh is a 60 y.o. male with a hx of pericardial effusion, DCM, HTN, prior DVT, RA.    Evaluated by Dr. Rollene Rotunda in 2012 for chest pain and dyspnea.  He had a hx of pericardial effusion s/p window in the 1990s and DCM in 2009 dx while critically ill with sepsis.  FU echo demonstrated normal LVEF and a nuclear stress test was neg for ischemia.  He has not been seen since that time.    Referred back by PCP with complaints of chest pain.  He has noted onset of several symptoms over the past 2 weeks. Prior to this time frame, his BP was controlled. He then started to note his BP increasing to 150-160/100s.  He also notes a fluttering in his chest as well as increasing DOE and occasional L sided "electric shock" chest pain. This chest pain is brief and not really related to exertion.  However, he also notes some exertional chest discomfort.  He has had some radiation to his jaw on 1 occasion and a few episodes of assoc nausea and diaphoresis.  He denies syncope.  He denies orthopnea, PND.  He does note L calf pain and L leg swelling. He saw his PCP who put him on Losartan.  His BP is better but his chest symptoms and DOE are persistent.  Lab work was done at PCP office.  He was told his A1c was elevated but he denies a dx of diabetes.     Past Medical History  Diagnosis Date  . Arthritis, rheumatoid (HCC)   . Septic shock(785.52) 2009    hx of  . Cardiomyopathy     assoc with sepsis >> resolved by echo in 2012  . Renal failure, acute (HCC)     assoc with sepsis in 2009  . Abscess of liver(572.0)    . Pericardial effusion     s/p pericardial window in 1990s  . DVT (deep venous thrombosis) (HCC)     provoked after abdominal surgery many years ago  . HTN (hypertension)   . Diverticulitis   . Hypogonadism in male     prior Testosterone use    Past Surgical History  Procedure Laterality Date  . Pericardial window    . Knee surgery      Left  . Cholecystectomy      Current Medications: Outpatient Prescriptions Prior to Visit  Medication Sig Dispense Refill  . folic acid (FOLVITE) 400 MCG tablet Take 400 mcg by mouth daily.      . methotrexate (RHEUMATREX) 2.5 MG tablet Take 2.5 mg by mouth once a week. Caution:Chemotherapy. Protect from light. Take 8 tablets     . Multiple Vitamin (MULTIVITAMIN) tablet Take 1 tablet by mouth daily.      . Omega-3 Fatty Acids (FISH OIL) 1000 MG CAPS Take 3 capsules by mouth daily.      . predniSONE (DELTASONE) 5 MG tablet Take 5 mg by mouth daily.      Marland Kitchen lisinopril (PRINIVIL,ZESTRIL) 10 MG tablet Take 10 mg by mouth daily. Reported  on 02/20/2016     No facility-administered medications prior to visit.      Allergies:   Latex   Social History   Social History  . Marital Status: Married    Spouse Name: N/A  . Number of Children: 4  . Years of Education: N/A   Occupational History  . Inventory specialist    Social History Main Topics  . Smoking status: Former Smoker    Quit date: 09/30/1996  . Smokeless tobacco: None  . Alcohol Use: 0.0 oz/week    0 Standard drinks or equivalent per week     Comment: occasional  . Drug Use: No  . Sexual Activity: Not Asked   Other Topics Concern  . 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 There is no history of Heart  attack.   ROS:   Please see the history of present illness.    Review of Systems  Constitution: Positive for diaphoresis and malaise/fatigue.  HENT: Positive for headaches.   Eyes: Positive for visual disturbance.  Cardiovascular: Positive for chest pain, irregular heartbeat and leg swelling.  Respiratory: Positive for shortness of breath.   Hematologic/Lymphatic: Bruises/bleeds easily.  Musculoskeletal: Positive for back pain, joint pain and joint swelling.  Gastrointestinal: Positive for abdominal pain and constipation.  Neurological: Positive for loss of balance.   All other systems reviewed and are negative.   Physical Exam:    VS:  BP 120/60 mmHg  Pulse 75  Ht 6\' 1"  (1.854 m)  Wt 268 lb (121.564 kg)  BMI 35.37 kg/m2   GEN: Well nourished, well developed, in no acute distress HEENT: normal Neck: no JVD, no masses Cardiac: Normal S1/S2, RRR; no murmurs, rubs, or gallops, no edema;  no carotid bruits,   Respiratory:  clear to auscultation bilaterally; no wheezing, rhonchi or rales GI: soft, nontender, nondistended MS: no deformity or atrophy Skin: warm and dry Neuro: No focal deficits  Psych: Alert and oriented x 3, normal affect  Wt Readings from Last 3 Encounters:  02/20/16 268 lb (121.564 kg)  07/10/11 267 lb (121.11 kg)  06/27/11 263 lb (119.296 kg)     Studies/Labs Reviewed:     EKG:  EKG is  ordered today.  The ekg ordered today demonstrates NSR, HR 75, normal axis, PVCs, Ventricular couplet, QTc 428 ms  Recent Labs: 02/20/2016: BUN 22; Creat 1.13; Potassium 4.5; Sodium 138; TSH 1.96   Recent Lipid Panel No results found for: CHOL, TRIG, HDL, CHOLHDL, VLDL, LDLCALC, LDLDIRECT  Additional studies/ records that were reviewed today include:   Echo 10/12 EF 55-60%, no RWMA, unusual RV apical morphology  Myoview 10/12 Overall Impression: Small fixed apical perfusion defect likely represents apical thinning. No evidence for ischemia or infarction. EF 51%  with abnormal septal motion (though bundle branch block not seen on ECG).    ASSESSMENT:     1. Other chest pain   2. Shortness of breath   3. PVC (premature ventricular contraction)   4. Essential hypertension   5. Pericardial effusion   6. Edema of left lower extremity     PLAN:     In order of problems listed above:  1.  Chest pain - He has typical and atypical features.  CRFs include age, gender, HTN and possibly DM.  ECG with PVCs and couplets.  At this point, stress testing seems the most appropriate method to evaluate for ischemia.  However, would have a low threshold to proceed to cardiac cath.  -  Arrange Lexiscan Myoview  -  Arrange Echo  -  Continue ASA 81 mg QD.  2. Shortness of breath - No evidence of volume excess on exam.  Will get Echo and Lexiscan Myoview as noted.  Request labs from recent visit to PCP.   3. PVCs - Will get echo as noted.  Also get BMET, TSH today.  Arrange 48 Hr Holter to assess PVC burden and r/o NSVT.    4. HTN - BP much better controlled.  Continue Losartan.  BMET today.  5. Pericardial Effusion - s/p Prior Window.  Repeat echo pending.    6. L leg edema - Exam of L leg not concerning for DVT.  He does have a prior hx and he is quite concerned about this.  Will get L leg venous duplex.     Medication Adjustments/Labs and Tests Ordered: Current medicines are reviewed at length with the patient today.  Concerns regarding medicines are outlined above.  Medication changes, Labs and Tests ordered today are outlined in the Patient Instructions noted below. Patient Instructions  Medication Instructions:  1. REMAIN ON ASPIRIN 81 MG DAILY Labwork: 1. TODAY BMET, TSH Testing/Procedures: 1. Your physician has requested that you have a lower extremity venous duplex DX EDEMA, R/O DVT LEFT LEG. This test is an ultrasound of the veins in the legs or arms. It looks at venous blood flow that carries blood from the heart to the legs or arms. Allow one  hour for a Lower Venous exam. Allow thirty minutes for an Upper Venous exam. There are no restrictions or special instructions. 2. Your physician has requested that you have a lexiscan myoview. For further information please visit [email or web address] Please follow instruction sheet, as given. 3. Your physician has recommended that you wear a 48 HOUR holter monitor. Holter monitors are medical devices that record the heart's electrical activity. Doctors most often use these monitors to diagnose arrhythmias. Arrhythmias are problems with the speed or rhythm of the heartbeat. The monitor is a small, portable device. You can wear one while you do your normal daily activities. This is usually used to diagnose what is causing palpitations/syncope (passing out). 4. Your physician has requested that you have an echocardiogram. Echocardiography is a painless test that uses sound waves to create images of your heart. It provides your doctor with information about the size and shape of your heart and how well your heart's chambers and valves are working. This procedure takes approximately one hour. There are no restrictions for this procedure. Follow-Up: KEEP YOUR APPT WITH DR. HOCHREIN 02/2016 Any Other Special Instructions Will Be Listed Below (If Applicable). WE HAD YOU FILL OUT A RELEASE OF INFORMATION FORM SO THAT WE MAY OBTAIN RECORDS FROM [place], [place] ([facility]) If you need a refill on your cardiac medications before your next appointment, please call your pharmacy.   Signed, Tereso Newcomer, PA-C  02/20/2016 10:32 PM    [facility] [address] [place], [place], [place]  [address] Phone: [phone]; Fax: [phone]

## 2016-02-20 ENCOUNTER — Ambulatory Visit (INDEPENDENT_AMBULATORY_CARE_PROVIDER_SITE_OTHER): Payer: BLUE CROSS/BLUE SHIELD | Admitting: Physician Assistant

## 2016-02-20 ENCOUNTER — Encounter: Payer: Self-pay | Admitting: Physician Assistant

## 2016-02-20 ENCOUNTER — Telehealth: Payer: Self-pay | Admitting: *Deleted

## 2016-02-20 VITALS — BP 120/60 | HR 75 | Ht 73.0 in | Wt 268.0 lb

## 2016-02-20 DIAGNOSIS — R0602 Shortness of breath: Secondary | ICD-10-CM

## 2016-02-20 DIAGNOSIS — I3139 Other pericardial effusion (noninflammatory): Secondary | ICD-10-CM

## 2016-02-20 DIAGNOSIS — R0789 Other chest pain: Secondary | ICD-10-CM

## 2016-02-20 DIAGNOSIS — I493 Ventricular premature depolarization: Secondary | ICD-10-CM | POA: Diagnosis not present

## 2016-02-20 DIAGNOSIS — I319 Disease of pericardium, unspecified: Secondary | ICD-10-CM

## 2016-02-20 DIAGNOSIS — I10 Essential (primary) hypertension: Secondary | ICD-10-CM

## 2016-02-20 DIAGNOSIS — R600 Localized edema: Secondary | ICD-10-CM

## 2016-02-20 DIAGNOSIS — I313 Pericardial effusion (noninflammatory): Secondary | ICD-10-CM

## 2016-02-20 LAB — BASIC METABOLIC PANEL
BUN: 22 mg/dL (ref 7–25)
CO2: 27 mmol/L (ref 20–31)
CREATININE: 1.13 mg/dL (ref 0.70–1.33)
Calcium: 8.9 mg/dL (ref 8.6–10.3)
Chloride: 102 mmol/L (ref 98–110)
GLUCOSE: 163 mg/dL — AB (ref 65–99)
Potassium: 4.5 mmol/L (ref 3.5–5.3)
Sodium: 138 mmol/L (ref 135–146)

## 2016-02-20 LAB — TSH: TSH: 1.96 m[IU]/L (ref 0.40–4.50)

## 2016-02-20 MED ORDER — ASPIRIN EC 81 MG PO TBEC: 81.0000 mg | DELAYED_RELEASE_TABLET | Freq: Every day | ORAL | Status: AC

## 2016-02-20 NOTE — Telephone Encounter (Signed)
Pt notified of lab results by phone with verbal understanding.  

## 2016-02-20 NOTE — Patient Instructions (Addendum)
Medication Instructions:  1. REMAIN ON ASPIRIN 81 MG DAILY Labwork: 1. TODAY BMET, TSH Testing/Procedures: 1. Your physician has requested that you have a lower extremity venous duplex DX EDEMA, R/O DVT LEFT LEG. This test is an ultrasound of the veins in the legs or arms. It looks at venous blood flow that carries blood from the heart to the legs or arms. Allow one hour for a Lower Venous exam. Allow thirty minutes for an Upper Venous exam. There are no restrictions or special instructions. 2. Your physician has requested that you have a lexiscan myoview. For further information please visit [email or web address] Please follow instruction sheet, as given. 3. Your physician has recommended that you wear a 48 HOUR holter monitor. Holter monitors are medical devices that record the heart's electrical activity. Doctors most often use these monitors to diagnose arrhythmias. Arrhythmias are problems with the speed or rhythm of the heartbeat. The monitor is a small, portable device. You can wear one while you do your normal daily activities. This is usually used to diagnose what is causing palpitations/syncope (passing out). 4. Your physician has requested that you have an echocardiogram. Echocardiography is a painless test that uses sound waves to create images of your heart. It provides your doctor with information about the size and shape of your heart and how well your heart's chambers and valves are working. This procedure takes approximately one hour. There are no restrictions for this procedure. Follow-Up: KEEP YOUR APPT WITH DR. HOCHREIN 02/2016 Any Other Special Instructions Will Be Listed Below (If Applicable). WE HAD YOU FILL OUT A RELEASE OF INFORMATION FORM SO THAT WE MAY OBTAIN RECORDS FROM [place], [place] ([facility]) If you need a refill on your cardiac medications before your next appointment, please call your pharmacy.

## 2016-02-23 ENCOUNTER — Other Ambulatory Visit: Payer: Self-pay | Admitting: Physician Assistant

## 2016-02-23 ENCOUNTER — Ambulatory Visit (HOSPITAL_COMMUNITY)
Admission: RE | Admit: 2016-02-23 | Discharge: 2016-02-23 | Disposition: A | Discharge status: Home / Self Care | Payer: BLUE CROSS/BLUE SHIELD | Source: Ambulatory Visit | Attending: Cardiovascular Disease | Admitting: Cardiovascular Disease

## 2016-02-23 ENCOUNTER — Ambulatory Visit (INDEPENDENT_AMBULATORY_CARE_PROVIDER_SITE_OTHER): Payer: BLUE CROSS/BLUE SHIELD

## 2016-02-23 DIAGNOSIS — I493 Ventricular premature depolarization: Secondary | ICD-10-CM

## 2016-02-23 DIAGNOSIS — R609 Edema, unspecified: Secondary | ICD-10-CM | POA: Diagnosis present

## 2016-02-23 DIAGNOSIS — I319 Disease of pericardium, unspecified: Secondary | ICD-10-CM | POA: Diagnosis not present

## 2016-02-23 DIAGNOSIS — I10 Essential (primary) hypertension: Secondary | ICD-10-CM | POA: Insufficient documentation

## 2016-02-23 DIAGNOSIS — I313 Pericardial effusion (noninflammatory): Secondary | ICD-10-CM

## 2016-02-23 DIAGNOSIS — R600 Localized edema: Secondary | ICD-10-CM | POA: Insufficient documentation

## 2016-02-23 DIAGNOSIS — I3139 Other pericardial effusion (noninflammatory): Secondary | ICD-10-CM

## 2016-02-27 ENCOUNTER — Telehealth: Payer: Self-pay | Admitting: *Deleted

## 2016-02-27 NOTE — Telephone Encounter (Signed)
DPR ok to lmom. No DVT, continue on current Tx plan. If any questions please call the office [phone].

## 2016-03-04 ENCOUNTER — Telehealth (HOSPITAL_COMMUNITY): Payer: Self-pay | Admitting: *Deleted

## 2016-03-04 NOTE — Telephone Encounter (Signed)
Left message on voicemail per DPR in reference to upcoming appointment scheduled on 03/06/16 at 0945 with detailed instructions given per Myocardial Perfusion Study Information Sheet for the test. LM to arrive 15 minutes early, and that it is imperative to arrive on time for appointment to keep from having the test rescheduled. If you need to cancel or reschedule your appointment, please call the office within 24 hours of your appointment. Failure to do so may result in a cancellation of your appointment, and a $50 no show fee. Phone number given for call back for any questions. Kabrina Christiano, Adelene Idler

## 2016-03-06 ENCOUNTER — Ambulatory Visit (HOSPITAL_COMMUNITY): Payer: BLUE CROSS/BLUE SHIELD | Attending: Cardiology

## 2016-03-06 ENCOUNTER — Encounter: Payer: Self-pay | Admitting: Physician Assistant

## 2016-03-06 ENCOUNTER — Ambulatory Visit (HOSPITAL_BASED_OUTPATIENT_CLINIC_OR_DEPARTMENT_OTHER): Payer: BLUE CROSS/BLUE SHIELD

## 2016-03-06 ENCOUNTER — Other Ambulatory Visit: Payer: Self-pay

## 2016-03-06 DIAGNOSIS — R9439 Abnormal result of other cardiovascular function study: Secondary | ICD-10-CM | POA: Diagnosis not present

## 2016-03-06 DIAGNOSIS — I429 Cardiomyopathy, unspecified: Secondary | ICD-10-CM | POA: Diagnosis not present

## 2016-03-06 DIAGNOSIS — I313 Pericardial effusion (noninflammatory): Secondary | ICD-10-CM

## 2016-03-06 DIAGNOSIS — R0789 Other chest pain: Secondary | ICD-10-CM | POA: Insufficient documentation

## 2016-03-06 DIAGNOSIS — I319 Disease of pericardium, unspecified: Secondary | ICD-10-CM | POA: Diagnosis not present

## 2016-03-06 DIAGNOSIS — I4729 Other ventricular tachycardia: Secondary | ICD-10-CM

## 2016-03-06 DIAGNOSIS — I77810 Thoracic aortic ectasia: Secondary | ICD-10-CM | POA: Insufficient documentation

## 2016-03-06 DIAGNOSIS — R002 Palpitations: Secondary | ICD-10-CM | POA: Diagnosis not present

## 2016-03-06 DIAGNOSIS — I472 Ventricular tachycardia: Secondary | ICD-10-CM

## 2016-03-06 DIAGNOSIS — I119 Hypertensive heart disease without heart failure: Secondary | ICD-10-CM | POA: Insufficient documentation

## 2016-03-06 DIAGNOSIS — R0609 Other forms of dyspnea: Secondary | ICD-10-CM | POA: Insufficient documentation

## 2016-03-06 DIAGNOSIS — I3139 Other pericardial effusion (noninflammatory): Secondary | ICD-10-CM

## 2016-03-06 DIAGNOSIS — R079 Chest pain, unspecified: Secondary | ICD-10-CM | POA: Diagnosis present

## 2016-03-06 HISTORY — DX: Ventricular tachycardia: I47.2

## 2016-03-06 HISTORY — DX: Other ventricular tachycardia: I47.29

## 2016-03-06 LAB — MYOCARDIAL PERFUSION IMAGING
CHL CUP NUCLEAR SDS: 0
CHL CUP RESTING HR STRESS: 57 {beats}/min
CSEPPHR: 136 {beats}/min
LHR: 0.31
LVDIAVOL: 181 mL (ref 62–150)
LVSYSVOL: 124 mL
SRS: 3
SSS: 3
TID: 1.06

## 2016-03-06 MED ORDER — TECHNETIUM TC 99M TETROFOSMIN IV KIT
10.1000 | PACK | Freq: Once | INTRAVENOUS | Status: AC | PRN
  Administered 2016-03-06: 10.1 via INTRAVENOUS
  Filled 2016-03-06: qty 10

## 2016-03-06 MED ORDER — REGADENOSON 0.4 MG/5ML IV SOLN
0.4000 mg | Freq: Once | INTRAVENOUS | Status: AC
  Administered 2016-03-06: 0.4 mg via INTRAVENOUS

## 2016-03-06 MED ORDER — TECHNETIUM TC 99M TETROFOSMIN IV KIT
32.6000 | PACK | Freq: Once | INTRAVENOUS | Status: AC | PRN
  Administered 2016-03-06: 33 via INTRAVENOUS
  Filled 2016-03-06: qty 33

## 2016-03-06 NOTE — Progress Notes (Signed)
Cardiology Office Note   Date:  03/07/2016   ID:  Harry Marsh, DOB 1956/08/02, MRN [id number]  PCP:  No primary care provider on file.  Cardiologist:   Rollene Rotunda, MD   Chief Complaint  Patient presents with  . Chest Pain      History of Present Illness: Harry Marsh is a 60 y.o. 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 I saw him in 2012 for evaluation of chest pain and dyspnea. He had a pericardial effusion window in the 1990s. He had a dilated cardiomyopathy while he was critically ill with sepsis. Follow-up echo however was normal with no evidence of ischemia. He had an abnormal EKG on a physical in May and was having chest pain. He was seen recently by Mr. Alben Spittle.  He been having some chest discomfort. He was sent late last month for a stress perfusion study. This demonstrated a medium-sized defect of mild severity in the basal inferior, mid inferior, apical inferior and apical lateral location. However, the ejection fraction was also reduced at 32%. This was new compared to previous. Echocardiogram confirmed the reduced ejection fraction of 25-30%. He did appear to have pericardial thickening as well.  He reports that his discomfort happened severely 112. He had some discomfort that was of his left chest and in his arm.  He had not had this kind of pain before.  He noted that his BP was elevated.  He went to [facility] three days ater and was noted to have T wave inversion on his EKG but was not having pain at that time.  He has since not had any severe pain.  He does get some chest discomfort at his previous scar. This is reproducible and made worse with palpation. He might have some vague chest discomfort sporadically in his upper chest and his left arm but this is light and not reproducible. He's not had any new shortness of breath, PND or orthopnea. He's not had any new palpitations, presyncope or syncope.  Past Medical History    Diagnosis Date  . Arthritis, rheumatoid (HCC)   . Septic shock(785.52) 2009    hx of  . Cardiomyopathy   . Renal failure, acute (HCC)     assoc with sepsis in 2009  . Abscess of liver(572.0)   . Pericardial effusion     s/p pericardial window in 1990s  . DVT (deep venous thrombosis) (HCC)     provoked after abdominal surgery many years ago  . HTN (hypertension)   . Diverticulitis   . Hypogonadism in male     prior Testosterone use  . NSVT (nonsustained ventricular tachycardia) (HCC) 03/06/2016    Holter 6/17:  SR/Sinus brady, Freq PVCs (9.45%), NSVT (4-5 beats)  . Cardiomyopathy (HCC) 01/19/2008    a.  assoc with sepsis >> resolved by echo in 2012 //  b. Myoview 6/17: EF 32%, inf, apical inf, apical lateral infarct vs diaph atten, inf-septal defect likely artifact, Intermediate Risk //  c.  Echo 6/17: EF 25-30%, diff HK, Gr 2 DD, dilated Ao root (39 mm) and ascending Aorta (40 mm), mild LAE, mild reduced RVSF, pericardium thickened, concern for pericardial constriction      Past Surgical History  Procedure Laterality Date  . Pericardial window    . Knee surgery      Left  . Cholecystectomy       Current Outpatient Prescriptions  Medication Sig Dispense Refill  . aspirin EC 81 MG tablet Take  1 tablet (81 mg total) by mouth daily.    Marland Kitchen etanercept (ENBREL SURECLICK) 50 MG/ML injection Inject 50 mg into the skin once a week.    . folic acid (FOLVITE) 400 MCG tablet Take 400 mcg by mouth daily.      Marland Kitchen HYDROcodone-acetaminophen (NORCO/VICODIN) 5-325 MG tablet Take 1 tablet by mouth 2 (two) times daily as needed for moderate pain or severe pain.   0  . losartan (COZAAR) 50 MG tablet Take 50 mg by mouth daily.  1  . methotrexate (RHEUMATREX) 2.5 MG tablet Take 2.5 mg by mouth once a week. Caution:Chemotherapy. Protect from light. Take 8 tablets     . Multiple Vitamin (MULTIVITAMIN) tablet Take 1 tablet by mouth daily.      . Omega-3 Fatty Acids (FISH OIL) 1000 MG CAPS Take 3 capsules  by mouth daily.      . predniSONE (DELTASONE) 5 MG tablet Take 5 mg by mouth daily.       No current facility-administered medications for this visit.    Allergies:   Latex    Social History:  The patient  reports that he quit smoking about 19 years ago. He does not have any smokeless tobacco history on file. He reports that he drinks alcohol. He reports that he does not use illicit drugs.   Family History:  The patient's family history includes Arthritis in his father. There is no history of Heart attack.    ROS:  Please see the history of present illness.   Otherwise, review of systems are positive for Mild swelling in his legs with discoloration and dependent rubor.   All other systems are reviewed and negative.    PHYSICAL EXAM: VS:  BP 146/95 mmHg  Pulse 62  Ht 6\' 1"  (1.854 m)  Wt 268 lb (121.564 kg)  BMI 35.37 kg/m2 , BMI Body mass index is 35.37 kg/(m^2). GENERAL:  Well appearing HEENT:  Pupils equal round and reactive, fundi not visualized, oral mucosa unremarkable NECK:  No jugular venous distention, waveform within normal limits, carotid upstroke brisk and symmetric, no bruits, no thyromegaly LYMPHATICS:  No cervical, inguinal adenopathy LUNGS:  Clear to auscultation bilaterally BACK:  No CVA tenderness CHEST:  Unremarkable HEART:  PMI not displaced or sustained,S1 and S2 within normal limits, no S3, no S4, no clicks, no rubs, no murmurs ABD:  Flat, positive bowel sounds normal in frequency in pitch, no bruits, no rebound, no guarding, no midline pulsatile mass, no hepatomegaly, no splenomegaly EXT:  2 plus pulses throughout, trace edema, no cyanosis no clubbing, chronic venous stasis changes mild with dependent rubor SKIN:  No rashes no nodules NEURO:  Cranial nerves II through XII grossly intact, motor grossly intact throughout PSYCH:  Cognitively intact, oriented to person place and time    EKG:  EKG is not ordered today.    Recent Labs: 02/20/2016: BUN 22;  Creat 1.13; Potassium 4.5; Sodium 138; TSH 1.96    Lipid Panel No results found for: CHOL, TRIG, HDL, CHOLHDL, VLDL, LDLCALC, LDLDIRECT    Wt Readings from Last 3 Encounters:  03/07/16 268 lb (121.564 kg)  03/06/16 268 lb (121.564 kg)  02/20/16 268 lb (121.564 kg)      Other studies Reviewed: Additional studies/ records that were reviewed today include: Echo and Lexiscan Myoview. . Review of the above records demonstrates:  Please see elsewhere in the note.     ASSESSMENT AND PLAN:  Chest pain -   the patient has an intermediate risk  study but a markedly reduced ejection fraction compared to previous. I'm afraid that he might have had an out of hospital myocardial infarction several weeks ago. Cardiac catheterization is indicated. The patient understands that risks included but are not limited to stroke (1 in 1000), death (1 in 1000), kidney failure [usually temporary] (1 in 500), bleeding (1 in 200), allergic reaction [possibly serious] (1 in 200).  The patient understands and agrees to proceed.   Shortness of breath -    This will be assessed as above.  He will need to have his meds titrated for his cardiomyopathy post cath.   PVCs - I reviewed a Holter that was done following his last appointment. He has frequent ventricular ectopy with some nonsustained ventricular tachycardia. I will begin by titrating medications and looking for opportunities for need to reduce ischemic burden which I suspect. Further management will be based on future ejection fraction. He's not particular he symptomatic with his palpitations at this point.  HTN -This is being managed in the context of treating his CHF  Pericardial Effusion -   He has no evidence of recurrent effusion.   L leg edema -   There was no evidence of DVT on the Doppler.  I reviewed this.  He will likely have continued conservative management of his venous insufficiency.   Current medicines are reviewed at length with the patient  today.  The patient does not have concerns regarding medicines.  The following changes have been made:  no change  Labs/ tests ordered today include:   Orders Placed This Encounter  Procedures  . CBC  . COMPLETE METABOLIC PANEL WITH GFR  . TSH  . INR/PT  . APTT  . LEFT HEART CATHETERIZATION WITH CORONARY ANGIOGRAM     Disposition:   FU with me after the cath.     Signed, Rollene Rotunda, MD  03/07/2016 1:05 PM    [facility]

## 2016-03-07 ENCOUNTER — Ambulatory Visit (INDEPENDENT_AMBULATORY_CARE_PROVIDER_SITE_OTHER): Payer: BLUE CROSS/BLUE SHIELD | Admitting: Cardiology

## 2016-03-07 ENCOUNTER — Other Ambulatory Visit: Payer: Self-pay | Admitting: Cardiology

## 2016-03-07 ENCOUNTER — Encounter: Payer: Self-pay | Admitting: Interventional Cardiology

## 2016-03-07 ENCOUNTER — Encounter: Payer: Self-pay | Admitting: Cardiology

## 2016-03-07 VITALS — BP 146/95 | HR 62 | Ht 73.0 in | Wt 268.0 lb

## 2016-03-07 DIAGNOSIS — R5383 Other fatigue: Secondary | ICD-10-CM

## 2016-03-07 DIAGNOSIS — D689 Coagulation defect, unspecified: Secondary | ICD-10-CM | POA: Diagnosis not present

## 2016-03-07 DIAGNOSIS — I255 Ischemic cardiomyopathy: Secondary | ICD-10-CM

## 2016-03-07 DIAGNOSIS — Z01818 Encounter for other preprocedural examination: Secondary | ICD-10-CM | POA: Diagnosis not present

## 2016-03-07 DIAGNOSIS — I200 Unstable angina: Secondary | ICD-10-CM

## 2016-03-07 DIAGNOSIS — R9439 Abnormal result of other cardiovascular function study: Secondary | ICD-10-CM | POA: Diagnosis not present

## 2016-03-07 LAB — COMPLETE METABOLIC PANEL WITH GFR
ALBUMIN: 4.2 g/dL (ref 3.6–5.1)
ALT: 46 U/L (ref 9–46)
AST: 33 U/L (ref 10–35)
Alkaline Phosphatase: 89 U/L (ref 40–115)
BILIRUBIN TOTAL: 0.8 mg/dL (ref 0.2–1.2)
BUN: 18 mg/dL (ref 7–25)
CO2: 25 mmol/L (ref 20–31)
Calcium: 9.3 mg/dL (ref 8.6–10.3)
Chloride: 103 mmol/L (ref 98–110)
Creat: 1.09 mg/dL (ref 0.70–1.33)
GFR, Est African American: 85 mL/min (ref >=60)
GFR, Est Non African American: 74 mL/min (ref >=60)
GLUCOSE: 123 mg/dL — AB (ref 65–99)
Potassium: 4.6 mmol/L (ref 3.5–5.3)
SODIUM: 139 mmol/L (ref 135–146)
TOTAL PROTEIN: 7.3 g/dL (ref 6.1–8.1)

## 2016-03-07 LAB — PROTIME-INR
INR: 1.02 (ref <1.50)
Prothrombin Time: 13.5 seconds (ref 11.6–15.2)

## 2016-03-07 LAB — CBC
HCT: 45.9 % (ref 38.5–50.0)
Hemoglobin: 16.3 g/dL (ref 13.2–17.1)
MCH: 33.7 pg — ABNORMAL HIGH (ref 27.0–33.0)
MCHC: 35.5 g/dL (ref 32.0–36.0)
MCV: 94.8 fL (ref 80.0–100.0)
MPV: 10.2 fL (ref 7.5–12.5)
PLATELETS: 188 10*3/uL (ref 140–400)
RBC: 4.84 MIL/uL (ref 4.20–5.80)
RDW: 14.8 % (ref 11.0–15.0)
WBC: 8.7 10*3/uL (ref 3.8–10.8)

## 2016-03-07 LAB — TSH: TSH: 1.75 mIU/L (ref 0.40–4.50)

## 2016-03-07 LAB — APTT: aPTT: 27 seconds (ref 24–37)

## 2016-03-07 NOTE — Patient Instructions (Signed)
Your physician has requested that you have a cardiac catheterization. Cardiac catheterization is used to diagnose and/or treat various heart conditions. Doctors may recommend this procedure for a number of different reasons. The most common reason is to evaluate chest pain. Chest pain can be a symptom of coronary artery disease (CAD), and cardiac catheterization can show whether plaque is narrowing or blocking your heart's arteries. This procedure is also used to evaluate the valves, as well as measure the blood flow and oxygen levels in different parts of your heart. For further information please visit [email or web address]. Please follow instruction sheet, as given.  Your physician recommends that you return for lab work   

## 2016-03-08 ENCOUNTER — Encounter (HOSPITAL_COMMUNITY)
Admission: RE | Disposition: A | Discharge status: Home / Self Care | Payer: Self-pay | Source: Ambulatory Visit | Attending: Interventional Cardiology

## 2016-03-08 ENCOUNTER — Ambulatory Visit (HOSPITAL_COMMUNITY)
Admission: RE | Admit: 2016-03-08 | Discharge: 2016-03-08 | Disposition: A | Discharge status: Home / Self Care | Payer: BLUE CROSS/BLUE SHIELD | Source: Ambulatory Visit | Attending: Interventional Cardiology | Admitting: Interventional Cardiology

## 2016-03-08 DIAGNOSIS — I110 Hypertensive heart disease with heart failure: Secondary | ICD-10-CM | POA: Insufficient documentation

## 2016-03-08 DIAGNOSIS — I311 Chronic constrictive pericarditis: Secondary | ICD-10-CM | POA: Insufficient documentation

## 2016-03-08 DIAGNOSIS — I493 Ventricular premature depolarization: Secondary | ICD-10-CM | POA: Diagnosis not present

## 2016-03-08 DIAGNOSIS — I2510 Atherosclerotic heart disease of native coronary artery without angina pectoris: Secondary | ICD-10-CM | POA: Diagnosis not present

## 2016-03-08 DIAGNOSIS — Z7982 Long term (current) use of aspirin: Secondary | ICD-10-CM | POA: Insufficient documentation

## 2016-03-08 DIAGNOSIS — I429 Cardiomyopathy, unspecified: Secondary | ICD-10-CM | POA: Diagnosis not present

## 2016-03-08 DIAGNOSIS — I509 Heart failure, unspecified: Secondary | ICD-10-CM | POA: Diagnosis not present

## 2016-03-08 DIAGNOSIS — I472 Ventricular tachycardia: Secondary | ICD-10-CM | POA: Insufficient documentation

## 2016-03-08 DIAGNOSIS — R0602 Shortness of breath: Secondary | ICD-10-CM | POA: Diagnosis not present

## 2016-03-08 DIAGNOSIS — M069 Rheumatoid arthritis, unspecified: Secondary | ICD-10-CM | POA: Diagnosis not present

## 2016-03-08 DIAGNOSIS — I200 Unstable angina: Secondary | ICD-10-CM

## 2016-03-08 DIAGNOSIS — Z87891 Personal history of nicotine dependence: Secondary | ICD-10-CM | POA: Insufficient documentation

## 2016-03-08 DIAGNOSIS — R9439 Abnormal result of other cardiovascular function study: Secondary | ICD-10-CM | POA: Insufficient documentation

## 2016-03-08 HISTORY — PX: CARDIAC CATHETERIZATION: SHX172

## 2016-03-08 LAB — PROTIME-INR
INR: 1.12 (ref 0.00–1.49)
PROTHROMBIN TIME: 14.6 s (ref 11.6–15.2)

## 2016-03-08 SURGERY — LEFT HEART CATH AND CORONARY ANGIOGRAPHY

## 2016-03-08 MED ORDER — VERAPAMIL HCL 2.5 MG/ML IV SOLN
INTRAVENOUS | Status: DC | PRN
  Administered 2016-03-08: 10 mL via INTRA_ARTERIAL

## 2016-03-08 MED ORDER — MIDAZOLAM HCL 2 MG/2ML IJ SOLN
INTRAMUSCULAR | Status: AC
  Filled 2016-03-08: qty 2

## 2016-03-08 MED ORDER — SODIUM CHLORIDE 0.9% FLUSH
3.0000 mL | Freq: Two times a day (BID) | INTRAVENOUS | Status: DC
Start: 2016-03-08 — End: 2016-03-08

## 2016-03-08 MED ORDER — SODIUM CHLORIDE 0.9 % IV SOLN
250.0000 mL | INTRAVENOUS | Status: DC | PRN
Start: 2016-03-08 — End: 2016-03-08

## 2016-03-08 MED ORDER — SODIUM CHLORIDE 0.9 % WEIGHT BASED INFUSION: 1.0000 mL/kg/h | INTRAVENOUS | Status: DC

## 2016-03-08 MED ORDER — HEPARIN (PORCINE) IN NACL 2-0.9 UNIT/ML-% IJ SOLN
INTRAMUSCULAR | Status: DC | PRN
  Administered 2016-03-08: 1500 mL

## 2016-03-08 MED ORDER — VERAPAMIL HCL 2.5 MG/ML IV SOLN
INTRAVENOUS | Status: AC
  Filled 2016-03-08: qty 2

## 2016-03-08 MED ORDER — FENTANYL CITRATE (PF) 100 MCG/2ML IJ SOLN
INTRAMUSCULAR | Status: AC
  Filled 2016-03-08: qty 2

## 2016-03-08 MED ORDER — SODIUM CHLORIDE 0.9 % IV SOLN: 250.0000 mL | INTRAVENOUS | Status: DC | PRN

## 2016-03-08 MED ORDER — HEPARIN (PORCINE) IN NACL 2-0.9 UNIT/ML-% IJ SOLN
INTRAMUSCULAR | Status: AC
  Filled 2016-03-08: qty 1500

## 2016-03-08 MED ORDER — HEPARIN SODIUM (PORCINE) 1000 UNIT/ML IJ SOLN
INTRAMUSCULAR | Status: AC
  Filled 2016-03-08: qty 1

## 2016-03-08 MED ORDER — MIDAZOLAM HCL 2 MG/2ML IJ SOLN
INTRAMUSCULAR | Status: DC | PRN
  Administered 2016-03-08 (×2): 1 mg via INTRAVENOUS
  Administered 2016-03-08: 2 mg via INTRAVENOUS

## 2016-03-08 MED ORDER — SODIUM CHLORIDE 0.9% FLUSH: 3.0000 mL | INTRAVENOUS | Status: DC | PRN

## 2016-03-08 MED ORDER — IOPAMIDOL (ISOVUE-370) INJECTION 76%
INTRAVENOUS | Status: DC | PRN
  Administered 2016-03-08: 85 mL via INTRAVENOUS

## 2016-03-08 MED ORDER — LIDOCAINE HCL (PF) 1 % IJ SOLN
INTRAMUSCULAR | Status: DC | PRN
  Administered 2016-03-08: 2 mL

## 2016-03-08 MED ORDER — LIDOCAINE HCL (PF) 1 % IJ SOLN
INTRAMUSCULAR | Status: AC
  Filled 2016-03-08: qty 30

## 2016-03-08 MED ORDER — IOPAMIDOL (ISOVUE-370) INJECTION 76%
INTRAVENOUS | Status: AC
  Filled 2016-03-08: qty 100

## 2016-03-08 MED ORDER — MIDAZOLAM HCL 2 MG/2ML IJ SOLN
INTRAMUSCULAR | Status: AC
Start: 2016-03-08 — End: 2016-03-08
  Filled 2016-03-08: qty 2

## 2016-03-08 MED ORDER — SODIUM CHLORIDE 0.9 % WEIGHT BASED INFUSION
3.0000 mL/kg/h | INTRAVENOUS | Status: AC
  Administered 2016-03-08: 3 mL/kg/h via INTRAVENOUS

## 2016-03-08 MED ORDER — SODIUM CHLORIDE 0.9% FLUSH: 3.0000 mL | Freq: Two times a day (BID) | INTRAVENOUS | Status: DC

## 2016-03-08 MED ORDER — HEPARIN SODIUM (PORCINE) 1000 UNIT/ML IJ SOLN
INTRAMUSCULAR | Status: DC | PRN
  Administered 2016-03-08: 6000 [IU] via INTRAVENOUS

## 2016-03-08 MED ORDER — FENTANYL CITRATE (PF) 100 MCG/2ML IJ SOLN
INTRAMUSCULAR | Status: DC | PRN
Start: 2016-03-08 — End: 2016-03-08
  Administered 2016-03-08: 50 ug via INTRAVENOUS
  Administered 2016-03-08 (×2): 25 ug via INTRAVENOUS

## 2016-03-08 SURGICAL SUPPLY — 10 items
CATH IMPULSE 5F ANG/FL3.5 (CATHETERS) ×3 IMPLANT
DEVICE RAD COMP TR BAND LRG (VASCULAR PRODUCTS) ×3 IMPLANT
GLIDESHEATH SLEND SS 6F .021 (SHEATH) ×3 IMPLANT
HOVERMATT SINGLE USE (MISCELLANEOUS) ×3 IMPLANT
KIT HEART LEFT (KITS) ×3 IMPLANT
PACK CARDIAC CATHETERIZATION (CUSTOM PROCEDURE TRAY) ×3 IMPLANT
SYR MEDRAD MARK V 150ML (SYRINGE) ×3 IMPLANT
TRANSDUCER W/STOPCOCK (MISCELLANEOUS) ×3 IMPLANT
TUBING CIL FLEX 10 FLL-RA (TUBING) ×3 IMPLANT
WIRE SAFE-T 1.5MM-J .035X260CM (WIRE) ×3 IMPLANT

## 2016-03-08 NOTE — H&P (View-Only) (Signed)
Cardiology Office Note   Date:  03/07/2016   ID:  Harry Marsh, DOB 1956/08/02, MRN [id number]  PCP:  No primary care provider on file.  Cardiologist:   Rollene Rotunda, MD   Chief Complaint  Patient presents with  . Chest Pain      History of Present Illness: Harry Marsh is a 60 y.o. 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 I saw him in 2012 for evaluation of chest pain and dyspnea. He had a pericardial effusion window in the 1990s. He had a dilated cardiomyopathy while he was critically ill with sepsis. Follow-up echo however was normal with no evidence of ischemia. He had an abnormal EKG on a physical in May and was having chest pain. He was seen recently by Mr. Alben Spittle.  He been having some chest discomfort. He was sent late last month for a stress perfusion study. This demonstrated a medium-sized defect of mild severity in the basal inferior, mid inferior, apical inferior and apical lateral location. However, the ejection fraction was also reduced at 32%. This was new compared to previous. Echocardiogram confirmed the reduced ejection fraction of 25-30%. He did appear to have pericardial thickening as well.  He reports that his discomfort happened severely 112. He had some discomfort that was of his left chest and in his arm.  He had not had this kind of pain before.  He noted that his BP was elevated.  He went to [facility] three days ater and was noted to have T wave inversion on his EKG but was not having pain at that time.  He has since not had any severe pain.  He does get some chest discomfort at his previous scar. This is reproducible and made worse with palpation. He might have some vague chest discomfort sporadically in his upper chest and his left arm but this is light and not reproducible. He's not had any new shortness of breath, PND or orthopnea. He's not had any new palpitations, presyncope or syncope.  Past Medical History    Diagnosis Date  . Arthritis, rheumatoid (HCC)   . Septic shock(785.52) 2009    hx of  . Cardiomyopathy   . Renal failure, acute (HCC)     assoc with sepsis in 2009  . Abscess of liver(572.0)   . Pericardial effusion     s/p pericardial window in 1990s  . DVT (deep venous thrombosis) (HCC)     provoked after abdominal surgery many years ago  . HTN (hypertension)   . Diverticulitis   . Hypogonadism in male     prior Testosterone use  . NSVT (nonsustained ventricular tachycardia) (HCC) 03/06/2016    Holter 6/17:  SR/Sinus brady, Freq PVCs (9.45%), NSVT (4-5 beats)  . Cardiomyopathy (HCC) 01/19/2008    a.  assoc with sepsis >> resolved by echo in 2012 //  b. Myoview 6/17: EF 32%, inf, apical inf, apical lateral infarct vs diaph atten, inf-septal defect likely artifact, Intermediate Risk //  c.  Echo 6/17: EF 25-30%, diff HK, Gr 2 DD, dilated Ao root (39 mm) and ascending Aorta (40 mm), mild LAE, mild reduced RVSF, pericardium thickened, concern for pericardial constriction      Past Surgical History  Procedure Laterality Date  . Pericardial window    . Knee surgery      Left  . Cholecystectomy       Current Outpatient Prescriptions  Medication Sig Dispense Refill  . aspirin EC 81 MG tablet Take  1 tablet (81 mg total) by mouth daily.    . etanercept (ENBREL SURECLICK) 50 MG/ML injection Inject 50 mg into the skin once a week.    . folic acid (FOLVITE) 400 MCG tablet Take 400 mcg by mouth daily.      . HYDROcodone-acetaminophen (NORCO/VICODIN) 5-325 MG tablet Take 1 tablet by mouth 2 (two) times daily as needed for moderate pain or severe pain.   0  . losartan (COZAAR) 50 MG tablet Take 50 mg by mouth daily.  1  . methotrexate (RHEUMATREX) 2.5 MG tablet Take 2.5 mg by mouth once a week. Caution:Chemotherapy. Protect from light. Take 8 tablets     . Multiple Vitamin (MULTIVITAMIN) tablet Take 1 tablet by mouth daily.      . Omega-3 Fatty Acids (FISH OIL) 1000 MG CAPS Take 3 capsules  by mouth daily.      . predniSONE (DELTASONE) 5 MG tablet Take 5 mg by mouth daily.       No current facility-administered medications for this visit.    Allergies:   Latex    Social History:  The patient  reports that he quit smoking about 19 years ago. He does not have any smokeless tobacco history on file. He reports that he drinks alcohol. He reports that he does not use illicit drugs.   Family History:  The patient's family history includes Arthritis in his father. There is no history of Heart attack.    ROS:  Please see the history of present illness.   Otherwise, review of systems are positive for Mild swelling in his legs with discoloration and dependent rubor.   All other systems are reviewed and negative.    PHYSICAL EXAM: VS:  BP 146/95 mmHg  Pulse 62  Ht 6' 1" (1.854 m)  Wt 268 lb (121.564 kg)  BMI 35.37 kg/m2 , BMI Body mass index is 35.37 kg/(m^2). GENERAL:  Well appearing HEENT:  Pupils equal round and reactive, fundi not visualized, oral mucosa unremarkable NECK:  No jugular venous distention, waveform within normal limits, carotid upstroke brisk and symmetric, no bruits, no thyromegaly LYMPHATICS:  No cervical, inguinal adenopathy LUNGS:  Clear to auscultation bilaterally BACK:  No CVA tenderness CHEST:  Unremarkable HEART:  PMI not displaced or sustained,S1 and S2 within normal limits, no S3, no S4, no clicks, no rubs, no murmurs ABD:  Flat, positive bowel sounds normal in frequency in pitch, no bruits, no rebound, no guarding, no midline pulsatile mass, no hepatomegaly, no splenomegaly EXT:  2 plus pulses throughout, trace edema, no cyanosis no clubbing, chronic venous stasis changes mild with dependent rubor SKIN:  No rashes no nodules NEURO:  Cranial nerves II through XII grossly intact, motor grossly intact throughout PSYCH:  Cognitively intact, oriented to person place and time    EKG:  EKG is not ordered today.    Recent Labs: 02/20/2016: BUN 22;  Creat 1.13; Potassium 4.5; Sodium 138; TSH 1.96    Lipid Panel No results found for: CHOL, TRIG, HDL, CHOLHDL, VLDL, LDLCALC, LDLDIRECT    Wt Readings from Last 3 Encounters:  03/07/16 268 lb (121.564 kg)  03/06/16 268 lb (121.564 kg)  02/20/16 268 lb (121.564 kg)      Other studies Reviewed: Additional studies/ records that were reviewed today include: Echo and Lexiscan Myoview. . Review of the above records demonstrates:  Please see elsewhere in the note.     ASSESSMENT AND PLAN:  Chest pain -   the patient has an intermediate risk   study but a markedly reduced ejection fraction compared to previous. I'm afraid that he might have had an out of hospital myocardial infarction several weeks ago. Cardiac catheterization is indicated. The patient understands that risks included but are not limited to stroke (1 in 1000), death (1 in 1000), kidney failure [usually temporary] (1 in 500), bleeding (1 in 200), allergic reaction [possibly serious] (1 in 200).  The patient understands and agrees to proceed.   Shortness of breath -    This will be assessed as above.  He will need to have his meds titrated for his cardiomyopathy post cath.   PVCs - I reviewed a Holter that was done following his last appointment. He has frequent ventricular ectopy with some nonsustained ventricular tachycardia. I will begin by titrating medications and looking for opportunities for need to reduce ischemic burden which I suspect. Further management will be based on future ejection fraction. He's not particular he symptomatic with his palpitations at this point.  HTN -This is being managed in the context of treating his CHF  Pericardial Effusion -   He has no evidence of recurrent effusion.   L leg edema -   There was no evidence of DVT on the Doppler.  I reviewed this.  He will likely have continued conservative management of his venous insufficiency.   Current medicines are reviewed at length with the patient  today.  The patient does not have concerns regarding medicines.  The following changes have been made:  no change  Labs/ tests ordered today include:   Orders Placed This Encounter  Procedures  . CBC  . COMPLETE METABOLIC PANEL WITH GFR  . TSH  . INR/PT  . APTT  . LEFT HEART CATHETERIZATION WITH CORONARY ANGIOGRAM     Disposition:   FU with me after the cath.     Signed, Rollene Rotunda, MD  03/07/2016 1:05 PM    [facility]

## 2016-03-08 NOTE — Progress Notes (Signed)
On arrival pt's wife pulled me aside and stated that he is acting odd x 2 days. Repeating himself, headache x 1 day and just odd. assessment done, equal grips, no drift in arms, pedal push is equal, pupils reactive, speech normal, tongue movement equal, Positive for right smile droop but pt feels that is normal, wife vague and thinks it may be different. Dr Eldridge Dace was paged and updated. Arlys John from cath lab was called and he will get a PA to come and see the pt.

## 2016-03-08 NOTE — Discharge Instructions (Signed)
Radial Site Care °Refer to this sheet in the next few weeks. These instructions provide you with information about caring for yourself after your procedure. Your health care provider may also give you more specific instructions. Your treatment has been planned according to current medical practices, but problems sometimes occur. Call your health care provider if you have any problems or questions after your procedure. °WHAT TO EXPECT AFTER THE PROCEDURE °After your procedure, it is typical to have the following: °· Bruising at the radial site that usually fades within 1-2 weeks. °· Blood collecting in the tissue (hematoma) that may be painful to the touch. It should usually decrease in size and tenderness within 1-2 weeks. °HOME CARE INSTRUCTIONS °· Take medicines only as directed by your health care provider. °· You may shower 24-48 hours after the procedure or as directed by your health care provider. Remove the bandage (dressing) and gently wash the site with plain soap and water. Pat the area dry with a clean towel. Do not rub the site, because this may cause bleeding. °· Do not take baths, swim, or use a hot tub until your health care provider approves. °· Check your insertion site every day for redness, swelling, or drainage. °· Do not apply powder or lotion to the site. °· Do not flex or bend the affected arm for 24 hours or as directed by your health care provider. °· Do not push or pull heavy objects with the affected arm for 24 hours or as directed by your health care provider. °· Do not lift over 10 lb (4.5 kg) for 5 days after your procedure or as directed by your health care provider. °· Ask your health care provider when it is okay to: °¨ Return to work or school. °¨ Resume usual physical activities or sports. °¨ Resume sexual activity. °· Do not drive home if you are discharged the same day as the procedure. Have someone else drive you. °· You may drive 24 hours after the procedure unless otherwise  instructed by your health care provider. °· Do not operate machinery or power tools for 24 hours after the procedure. °· If your procedure was done as an outpatient procedure, which means that you went home the same day as your procedure, a responsible adult should be with you for the first 24 hours after you arrive home. °· Keep all follow-up visits as directed by your health care provider. This is important. °SEEK MEDICAL CARE IF: °· You have a fever. °· You have chills. °· You have increased bleeding from the radial site. Hold pressure on the site. °SEEK IMMEDIATE MEDICAL CARE IF: °· You have unusual pain at the radial site. °· You have redness, warmth, or swelling at the radial site. °· You have drainage (other than a small amount of blood on the dressing) from the radial site. °· The radial site is bleeding, and the bleeding does not stop after 30 minutes of holding steady pressure on the site. °· Your arm or hand becomes pale, cool, tingly, or numb. °  °This information is not intended to replace advice given to you by your health care provider. Make sure you discuss any questions you have with your health care provider. °  °Document Released: 10/19/2010 Document Revised: 10/07/2014 Document Reviewed: 04/04/2014 °Elsevier Interactive Patient Education ©2016 [employer] ° °

## 2016-03-08 NOTE — Interval H&P Note (Signed)
Cath Lab Visit (complete for each Cath Lab visit)  Clinical Evaluation Leading to the Procedure:   ACS: No.  Non-ACS:    Anginal Classification: CCS III  Anti-ischemic medical therapy: Minimal Therapy (1 class of medications)  Non-Invasive Test Results: No non-invasive testing performed  Prior CABG: No previous CABG      History and Physical Interval Note:  03/08/2016 2:22 PM  Harry Marsh  has presented today for surgery, with the diagnosis of abnormal stress  The various methods of treatment have been discussed with the patient and family. After consideration of risks, benefits and other options for treatment, the patient has consented to  Procedure(s): Left Heart Cath and Coronary Angiography (N/A) as a surgical intervention .  The patient's history has been reviewed, patient examined, no change in status, stable for surgery.  I have reviewed the patient's chart and labs.  Questions were answered to the patient's satisfaction.     Lance Muss

## 2016-03-11 ENCOUNTER — Encounter (HOSPITAL_COMMUNITY): Payer: Self-pay | Admitting: Interventional Cardiology

## 2016-03-12 ENCOUNTER — Encounter: Payer: Self-pay | Admitting: Cardiology

## 2016-05-16 ENCOUNTER — Telehealth: Payer: Self-pay | Admitting: Physician Assistant

## 2016-05-16 NOTE — Telephone Encounter (Signed)
Records rec from [id number] to Coy Saunas

## 2016-12-01 IMAGING — NM NM MISC PROCEDURE
6 series · 36 of 36 positions shown · non-contrast
Comparison: none

[Series 1: wbr_s-proj_st stress-sum-em · 6.40mm/px · 6 of 64 frames shown]
[frame 6/64]
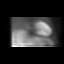
[frame 16/64]
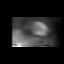
[frame 27/64]
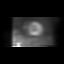
[frame 38/64]
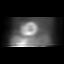
[frame 48/64]
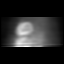
[frame 59/64]
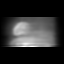

[Series 1: stress-gsp · 6.40mm/px · 6 of 502 frames shown]
[frame 42/502  full-range]
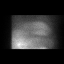
[frame 126/502  full-range]
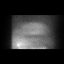
[frame 210/502  full-range]
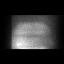
[frame 293/502  full-range]
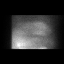
[frame 377/502  full-range]
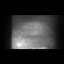
[frame 461/502  full-range]
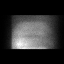

[Series 1: wbr_r-proj_st rest · 6.40mm/px · 6 of 64 frames shown]
[frame 6/64]
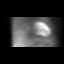
[frame 16/64]
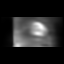
[frame 27/64]
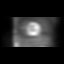
[frame 38/64]
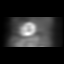
[frame 48/64]
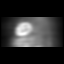
[frame 59/64]
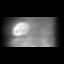

[Series 1: rest · 6.40mm/px · 6 of 63 frames shown]
[frame 6/63  full-range]
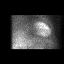
[frame 16/63  full-range]
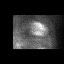
[frame 27/63  full-range]
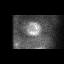
[frame 37/63  full-range]
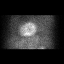
[frame 48/63  full-range]
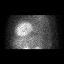
[frame 58/63  full-range]
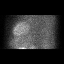

[Series 1: stress-sum-em · 6.40mm/px · 6 of 64 frames shown]
[frame 6/64]
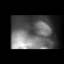
[frame 16/64]
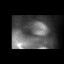
[frame 27/64]
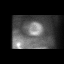
[frame 38/64]
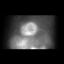
[frame 48/64]
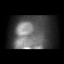
[frame 59/64]
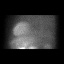

[Series 1: wbr_s-proj_st stress-gsp · 6.40mm/px · 6 of 512 frames shown]
[frame 43/512]
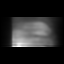
[frame 128/512]
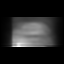
[frame 214/512]
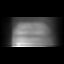
[frame 299/512]
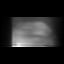
[frame 384/512]
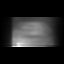
[frame 470/512]
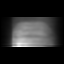

[36 of 36 positions shown; findings below may reference images not displayed]

Canned report from images found in remote index.

Refer to host system for actual result text.

## 2018-10-27 ENCOUNTER — Telehealth: Payer: Self-pay | Admitting: *Deleted

## 2018-10-27 NOTE — Telephone Encounter (Signed)
REFERRAL SENT TO SCHEDULING AND NOTES ON FILE FROM [facility] [phone] CONTACT TERRI NORDSTORM.

## 2018-11-14 NOTE — Progress Notes (Signed)
Cardiology Office Note   Date:  11/17/2018   ID:  Harry Marsh, DOB 08/29/1956, MRN [id number]  PCP:  Melburn Popper, FNP  Cardiologist:   Rollene Rotunda, MD   Chief Complaint  Patient presents with  . Cardiomyopathy      History of Present Illness: Harry Marsh is a 63 y.o. 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 I saw him in 2012 for evaluation of chest pain and dyspnea. He had a pericardial effusion window in the 1990s. He had a dilated cardiomyopathy while he was critically ill with sepsis. Follow-up echo however was normal.  In 2017 he had chest pain and a stress perfusion study. This demonstrated a medium-sized defect of mild severity in the basal inferior, mid inferior, apical inferior and apical lateral location. However, the ejection fraction was also reduced at 32%.  Echocardiogram confirmed the reduced ejection fraction of 25-30%. He did appear to have pericardial thickening as well.    Cardiac cath was done and he had minimal coronary disease.   He has not been back in a couple of years.  It sounds like insurance has been a bit of an issue.  He is had his care at the [place].  He was instructed however that he needed to follow-up here.  Since he was last seen he is lost about 40 pounds.  He is actually doing well and he denies any cardiovascular symptoms such as chest discomfort, neck or arm discomfort.  He is not reporting any shortness of breath, PND or orthopnea.  He has had problems with arthritis.  He is a little more difficulty walking in particular because he has some left foot pain.   Past Medical History:  Diagnosis Date  . Abscess of liver(572.0)   . Arthritis, rheumatoid (HCC)   . Cardiomyopathy   . Cardiomyopathy (HCC) 01/19/2008   a.  assoc with sepsis >> resolved by echo in 2012 //  b. Myoview 6/17: EF 32%, inf, apical inf, apical lateral infarct vs diaph atten, inf-septal defect likely artifact, Intermediate Risk //  c.  Echo 6/17: EF  25-30%, diff HK, Gr 2 DD, dilated Ao root (39 mm) and ascending Aorta (40 mm), mild LAE, mild reduced RVSF, pericardium thickened, concern for pericardial constriction    . Diverticulitis   . DVT (deep venous thrombosis) (HCC)    provoked after abdominal surgery many years ago  . HTN (hypertension)   . Hypogonadism in male    prior Testosterone use  . NSVT (nonsustained ventricular tachycardia) (HCC) 03/06/2016   Holter 6/17:  SR/Sinus brady, Freq PVCs (9.45%), NSVT (4-5 beats)  . Pericardial effusion    s/p pericardial window in 1990s  . Renal failure, acute (HCC)    assoc with sepsis in 2009  . Septic shock(785.52) 2009   hx of   PSH Cholecystectomy  Current Outpatient Medications  Medication Sig Dispense Refill  . aspirin EC 81 MG tablet Take 1 tablet (81 mg total) by mouth daily.    Marland Kitchen etanercept (ENBREL SURECLICK) 50 MG/ML injection Inject 50 mg into the skin once a week. Thursday    . folic acid (FOLVITE) 1 MG tablet Take 1 mg by mouth daily.    Marland Kitchen HYDROcodone-acetaminophen (NORCO/VICODIN) 5-325 MG tablet Take 1 tablet by mouth 2 (two) times daily as needed for moderate pain or severe pain.   0  . lisinopril (PRINIVIL,ZESTRIL) 10 MG tablet Take 10 mg by mouth daily.    . metFORMIN (GLUCOPHAGE) 500 MG tablet  Take 500 mg by mouth 2 (two) times daily with a meal.    . methotrexate (RHEUMATREX) 2.5 MG tablet Take 20 mg by mouth once a week. Thursday    . Multiple Vitamin (MULTIVITAMIN) tablet Take 1 tablet by mouth daily.      . naproxen sodium (ALEVE) 220 MG tablet Take 440 mg by mouth 2 (two) times daily.    . Omega-3 Fatty Acids (FISH OIL) 1000 MG CAPS Take 3 capsules by mouth daily.      . predniSONE (DELTASONE) 2.5 MG tablet Take 2.5 mg by mouth daily with breakfast.    . carvedilol (COREG) 3.125 MG tablet Take 1 tablet (3.125 mg total) by mouth 2 (two) times daily. 180 tablet 3   No current facility-administered medications for this visit.     Allergies:   Latex    Social  History:  The patient  reports that he quit smoking about 22 years ago. He has never used smokeless tobacco. He reports current alcohol use. He reports that he does not use drugs.   Family History:  The patient's family history includes Arthritis in his father.    ROS:  Please see the history of present illness.   Otherwise, review of systems are positive for none.   All other systems are reviewed and negative.    PHYSICAL EXAM: VS:  BP 132/80 (BP Location: Left Arm, Patient Position: Sitting, Cuff Size: Normal)   Pulse 74   Ht 6\' 1"  (1.854 m)   Wt 228 lb (103.4 kg)   BMI 30.08 kg/m  , BMI Body mass index is 30.08 kg/m. GENERAL:  Well appearing NECK:   Positive jugular venous distention 8 cm at 45 degrees with positive HJR, waveform within normal limits, carotid upstroke brisk and symmetric, no bruits, no thyromegaly LUNGS:  Clear to auscultation bilaterally CHEST:  Unremarkable HEART:  PMI not displaced or sustained,S1 and S2 within normal limits, no S3, no S4, no clicks, no rubs, no murmurs ABD:  Flat, positive bowel sounds normal in frequency in pitch, no bruits, no rebound, no guarding, no midline pulsatile mass, no hepatomegaly, no splenomegaly EXT:  2 plus pulses throughout, no edema, no cyanosis no clubbing   EKG:  EKG is ordered today. Sinus rhythm, rate 74, axis within normal limits, intervals within normal limits, poor anterior R wave progression, premature ectopic complexes, nonspecific diffuse T wave flattening.   Recent Labs: No results found for requested labs within last 8760 hours.    Lipid Panel No results found for: CHOL, TRIG, HDL, CHOLHDL, VLDL, LDLCALC, LDLDIRECT    Wt Readings from Last 3 Encounters:  11/17/18 228 lb (103.4 kg)  03/08/16 267 lb (121.1 kg)  03/07/16 268 lb (121.6 kg)      Other studies Reviewed: Additional studies/ records that were reviewed today include:   Previous cath and echo 2017 Review of the above records demonstrates:  NA     ASSESSMENT AND PLAN:  Chest pain -    Since his cath in 2017 he has had no new symptoms.  Given this no further cardiovascular testing is suggested from this standpoint.  He will continue with risk reduction.   Chronic systolic HF -   I am going to start carvedilol 3.125 mg twice daily.  He will need another echocardiogram to follow-up.  We had long discussion about his reduced ejection fraction.  Shortness of breath -    he is actually not complaining of shortness of breath.  At this point no  change in therapy other than above is indicated.  HTN -This is being managed in the context of treating his CHF.  Med changes as above.  Pericardial Effusion -    I will follow up with the echo as above.    Current medicines are reviewed at length with the patient today.  The patient does not have concerns regarding medicines.  The following changes have been made:  As above  Labs/ tests ordered today include:   Orders Placed This Encounter  Procedures  . EKG 12-Lead  . ECHOCARDIOGRAM COMPLETE     Disposition:   FU with me APP for further med titration. Forest Becker.     Signed, Raveen Wieseler, MD  11/17/2018 11:42 AM     [facility]

## 2018-11-17 ENCOUNTER — Encounter: Payer: Self-pay | Admitting: Cardiology

## 2018-11-17 ENCOUNTER — Ambulatory Visit (INDEPENDENT_AMBULATORY_CARE_PROVIDER_SITE_OTHER): Payer: No Typology Code available for payment source | Admitting: Cardiology

## 2018-11-17 ENCOUNTER — Encounter (INDEPENDENT_AMBULATORY_CARE_PROVIDER_SITE_OTHER): Payer: Self-pay

## 2018-11-17 VITALS — BP 132/80 | HR 74 | Ht 73.0 in | Wt 228.0 lb

## 2018-11-17 DIAGNOSIS — I429 Cardiomyopathy, unspecified: Secondary | ICD-10-CM

## 2018-11-17 DIAGNOSIS — I10 Essential (primary) hypertension: Secondary | ICD-10-CM

## 2018-11-17 MED ORDER — CARVEDILOL 3.125 MG PO TABS: 3.1250 mg | ORAL_TABLET | Freq: Two times a day (BID) | ORAL | 3 refills | Status: DC

## 2018-11-17 NOTE — Patient Instructions (Addendum)
Medication Instructions:  START- Carvedilol 3.125 mg twice a day  If you need a refill on your cardiac medications before your next appointment, please call your pharmacy.  Labwork: None Ordered   Take the provided lab slips with you to the lab for your blood draw.   When you have your labs (blood work) drawn today and your tests are completely normal, you will receive your results only by MyChart Message (if you have MyChart) -OR-  A paper copy in the mail.  If you have any lab test that is abnormal or we need to change your treatment, we will call you to review these results.  Testing/Procedures: Your physician has requested that you have an echocardiogram. Echocardiography is a painless test that uses sound waves to create images of your heart. It provides your doctor with information about the size and shape of your heart and how well your heart's chambers and valves are working. This procedure takes approximately one hour. There are no restrictions for this procedure.  Follow-Up: . Your physician recommends that you schedule a follow-up appointment in: 1 Months with Joni Reining   At [facility], you and your health needs are our priority.  As part of our continuing mission to provide you with exceptional heart care, we have created designated Provider Care Teams.  These Care Teams include your primary Cardiologist (physician) and Advanced Practice Providers (APPs -  Physician Assistants and Nurse Practitioners) who all work together to provide you with the care you need, when you need it.  Thank you for choosing [facility] at [facility]!!

## 2018-11-20 ENCOUNTER — Telehealth: Payer: Self-pay | Admitting: Cardiology

## 2018-11-20 MED ORDER — CARVEDILOL 3.125 MG PO TABS: 3.1250 mg | ORAL_TABLET | Freq: Two times a day (BID) | ORAL | 3 refills | Status: AC

## 2018-11-20 NOTE — Telephone Encounter (Signed)
Apologized to patient [facility] Printed Rx, Dr Antoine Poche signed, faxed, confirmation received and patient notified. Patient stated [facility] would call [facility] Monday and follow up. If not received he will call back.

## 2018-11-20 NOTE — Telephone Encounter (Signed)
New message   Pt c/o medication issue:  1. Name of Medication: carvedilol (COREG) 3.125 MG tablet  2. How are you currently taking this medication (dosage and times per day)? Twice daily  3. Are you having a reaction (difficulty breathing--STAT)? No   4. What is your medication issue? Patient states that this prescription was faxed to [facility] pharmacy in [place], [place] was not received. Please fax to [phone].

## 2018-12-01 ENCOUNTER — Ambulatory Visit (HOSPITAL_COMMUNITY): Payer: No Typology Code available for payment source | Attending: Cardiovascular Disease

## 2018-12-01 DIAGNOSIS — I429 Cardiomyopathy, unspecified: Secondary | ICD-10-CM

## 2018-12-01 MED ORDER — PERFLUTREN LIPID MICROSPHERE
1.0000 mL | INTRAVENOUS | Status: AC | PRN
  Administered 2018-12-01: 2 mL via INTRAVENOUS

## 2018-12-15 ENCOUNTER — Telehealth: Payer: Self-pay

## 2018-12-15 NOTE — Telephone Encounter (Signed)
COVID-19 Pre-Screening:  1. Have you been in contact with someone who was sick?  NO 2. Do you have any of the following symptoms (cough, fever, muscle pain, vomiting, diarrhea, weakness abdominal pain, rash, red eye, bruising or bleeding, joint pain, severe headache)?  NO 3. Have you travelled internationally or out of state in the last month?  NO   4. Do you need any refills at this time?  UNKNOWN=-WILL CALL PHARMACY FOR REFILLS  Priority patient needs to be seen: 3--12-16-18; MAY BE SEEN IN 3 MONTHS- [place] RJJO#[id number] 08-18-18 to 05-09-19. MAKE SURE TO RESHCEDULE WITHIN THISTIME  Patient aware of the following: Please be advised that we require, no one but yourself to come to appointment. If necessary, only one visitor may come with you into the building. They will also be asked the same screening questions. You will be contacted at a later time to reschedule. However, this will depend on ongoing evaluation of the Covid-19 situation.  Please call us if any new questions or concerns arise. We are here for advice.  Routing to COVID cancel pool.

## 2018-12-15 NOTE — Progress Notes (Deleted)
Cardiology Office Note   Date:  12/15/2018   ID:  Harry Marsh, DOB [birth year], MRN [id number]  PCP:  Melburn Popper, FNP  Cardiologist: Antoine Poche  No chief complaint on file.    History of Present Illness: Harry Marsh is a 63 y.o. male who presents for ongoing assessment and management of dilated cardiomyopathy, hx of pericardial effusion window in the 1990's.Marland Kitchen He had abnormal NM stress test in 2017 with reduced EF of 32%, and had subsequent cardiac cath which revealed minimal CAD.   He was seen last by Dr. Antoine Poche on 11/17/2018, and had lost 40 lbs, and was doing well. He was started on carvedilol 3.125 mg BID. He was to be scheduled for repeat echo. Echo on 12/01/2018, revealed EF of 40-45%, with possible constrictive physiology. EF was improved from prior echo.   Past Medical History:  Diagnosis Date  . Abscess of liver(572.0)   . Arthritis, rheumatoid (HCC)   . Cardiomyopathy   . Cardiomyopathy (HCC) 01/19/2008   a.  assoc with sepsis >> resolved by echo in 2012 //  b. Myoview 6/17: EF 32%, inf, apical inf, apical lateral infarct vs diaph atten, inf-septal defect likely artifact, Intermediate Risk //  c.  Echo 6/17: EF 25-30%, diff HK, Gr 2 DD, dilated Ao root (39 mm) and ascending Aorta (40 mm), mild LAE, mild reduced RVSF, pericardium thickened, concern for pericardial constriction    . Diverticulitis   . DVT (deep venous thrombosis) (HCC)    provoked after abdominal surgery many years ago  . HTN (hypertension)   . Hypogonadism in male    prior Testosterone use  . NSVT (nonsustained ventricular tachycardia) (HCC) 03/06/2016   Holter 6/17:  SR/Sinus brady, Freq PVCs (9.45%), NSVT (4-5 beats)  . Pericardial effusion    s/p pericardial window in 1990s  . Renal failure, acute (HCC)    assoc with sepsis in 2009  . Septic shock(785.52) 2009   hx of    Past Surgical History:  Procedure Laterality Date  . CARDIAC CATHETERIZATION N/A 03/08/2016   Procedure: Left Heart Cath and  Coronary Angiography;  Surgeon: Corky Crafts, MD;  Location: [facility] INVASIVE CV LAB;  Service: Cardiovascular;  Laterality: N/A;  . CHOLECYSTECTOMY    . KNEE SURGERY     Left  . PERICARDIAL WINDOW       Current Outpatient Medications  Medication Sig Dispense Refill  . aspirin EC 81 MG tablet Take 1 tablet (81 mg total) by mouth daily.    . carvedilol (COREG) 3.125 MG tablet Take 1 tablet (3.125 mg total) by mouth 2 (two) times daily. 180 tablet 3  . etanercept (ENBREL SURECLICK) 50 MG/ML injection Inject 50 mg into the skin once a week. Thursday    . folic acid (FOLVITE) 1 MG tablet Take 1 mg by mouth daily.    Marland Kitchen HYDROcodone-acetaminophen (NORCO/VICODIN) 5-325 MG tablet Take 1 tablet by mouth 2 (two) times daily as needed for moderate pain or severe pain.   0  . lisinopril (PRINIVIL,ZESTRIL) 10 MG tablet Take 10 mg by mouth daily.    . metFORMIN (GLUCOPHAGE) 500 MG tablet Take 500 mg by mouth 2 (two) times daily with a meal.    . methotrexate (RHEUMATREX) 2.5 MG tablet Take 20 mg by mouth once a week. Thursday    . Multiple Vitamin (MULTIVITAMIN) tablet Take 1 tablet by mouth daily.      . naproxen sodium (ALEVE) 220 MG tablet Take 440 mg by mouth 2 (two) times daily.    Marland Kitchen  Omega-3 Fatty Acids (FISH OIL) 1000 MG CAPS Take 3 capsules by mouth daily.      . predniSONE (DELTASONE) 2.5 MG tablet Take 2.5 mg by mouth daily with breakfast.     No current facility-administered medications for this visit.     Allergies:   Latex    Social History:  The patient  reports that he quit smoking about 22 years ago. He has never used smokeless tobacco. He reports current alcohol use. He reports that he does not use drugs.   Family History:  The patient's family history includes Arthritis in his father.    ROS: All other systems are reviewed and negative. Unless otherwise mentioned in H&P    PHYSICAL EXAM: VS:  There were no vitals taken for this visit. , BMI There is no height or weight on  file to calculate BMI. GEN: Well nourished, well developed, in no acute distress HEENT: normal Neck: no JVD, carotid bruits, or masses Cardiac: ***RRR; no murmurs, rubs, or gallops,no edema  Respiratory:  Clear to auscultation bilaterally, normal work of breathing GI: soft, nontender, nondistended, + BS MS: no deformity or atrophy Skin: warm and dry, no rash Neuro:  Strength and sensation are intact Psych: euthymic mood, full affect   EKG:  EKG {ACTION; IS/IS [id number]} ordered today. The ekg ordered today demonstrates ***   Recent Labs: No results found for requested labs within last 8760 hours.    Lipid Panel No results found for: CHOL, TRIG, HDL, CHOLHDL, VLDL, LDLCALC, LDLDIRECT    Wt Readings from Last 3 Encounters:  11/17/18 228 lb (103.4 kg)  03/08/16 267 lb (121.1 kg)  03/07/16 268 lb (121.6 kg)      Other studies Reviewed: Marland Kitchen. The left ventricle has mild-moderately reduced systolic function, with an ejection fraction of 40-45%. The cavity size was normal.  2. No evidence of left ventricular regional wall motion abnormalities.  3. Left ventricular diastolic Doppler parameters are consistent with pseudonormalization Elevated mean left atrial pressure. There is possible constrictive physiology.  4. Left atrial size was mildly dilated.  5. The pericardium appears thickened, especially along the diaphragmatic surface.  6. There is profound respiratory displacement of the ventricular septum and there is marked respiratory variation in early diastolic mitral flow suggesting ventricular interdependence due to constrictive physiology, but the inferior vena cava is not  dilated. 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  7. When compared to the prior study: 03/06/16 there is improved LV EF (was 25-30%).  ASSESSMENT AND PLAN:  1.  ***   Current medicines are reviewed at length with the patient today.    Labs/ tests ordered today include: *** Bettey MareKathryn M.  Liborio NixonLawrence DNP, ANP, AACC   12/15/2018 1:07 PM    [facility] [address] Office [phone] Fax [phone]

## 2018-12-15 NOTE — Telephone Encounter (Signed)
COVIDPRESCREENCHMG

## 2018-12-16 ENCOUNTER — Ambulatory Visit: Payer: Non-veteran care | Admitting: Adult Health

## 2019-01-19 ENCOUNTER — Telehealth: Payer: Self-pay

## 2019-01-19 NOTE — Telephone Encounter (Signed)
Virtual Visit Pre-Appointment Phone Call  Steps For Call:  1. Confirm consent - "In the setting of the current Covid19 crisis, you are scheduled for a (phone or video) visit with your provider on (4/30) at (1PM).  Just as we do with many in-office visits, in order for you to participate in this visit, we must obtain consent.  If you'd like, I can send this to your mychart (if signed up) or email for you to review.  Otherwise, I can obtain your verbal consent now.  All virtual visits are billed to your insurance company just like a normal visit would be.  By agreeing to a virtual visit, we'd like you to understand that the technology does not allow for your provider to perform an examination, and thus may limit your provider's ability to fully assess your condition. If your provider identifies any concerns that need to be evaluated in person, we will make arrangements to do so.  Finally, though the technology is pretty good, we cannot assure that it will always work on either your or our end, and in the setting of a video visit, we may have to convert it to a phone-only visit.  In either situation, we cannot ensure that we have a secure connection.  Are you willing to proceed?" STAFF: Did the patient verbally acknowledge consent to telehealth visit? Document YES/NO here: YES  2. Confirm the BEST phone number to call the day of the visit by including in appointment notes  3. Give patient instructions for MyChart download to smartphone OR [email or web address] as below if video visit (depending on what platform provider is using)  4. Confirm that appointment type is correct in Epic appointment notes (VIDEO vs PHONE)  5. Advise patient to be prepared with their blood pressure, heart rate, weight, any heart rhythm information, their current medicines, and a piece of paper and pen handy for any instructions they may receive the day of their visit  6. Inform patient they will receive a phone call 15 minutes  prior to their appointment time (may be from unknown caller ID) so they should be prepared to answer    TELEPHONE CALL NOTE  Harry Marsh has been deemed a candidate for a follow-up tele-health visit to limit community exposure during the Covid-19 pandemic. I spoke with the patient via phone to ensure availability of phone/video source, confirm preferred email & phone number, and discuss instructions and expectations.  I reminded Harry Marsh to be prepared with any vital sign and/or heart rhythm information that could potentially be obtained via home monitoring, at the time of his visit. I reminded Harry Marsh to expect a phone call prior to his visit.  Adline Peals, CMA 01/19/2019 9:35 AM   INSTRUCTIONS FOR DOWNLOADING THE MYCHART APP TO SMARTPHONE  - The patient must first make sure to have activated MyChart and know their login information - If Apple, go to [employer] and type in MyChart in the search bar and download the app. If Android, ask patient to go to [employer] and type in [place] in the search bar and download the app. The app is free but as with any other app downloads, their phone may require them to verify saved payment information or Apple/Android password.  - The patient will need to then log into the app with their MyChart username and password, and select  as their healthcare provider to link the account. When it is time for your visit, go to  the MyChart app, find appointments, and click Begin Video Visit. Be sure to Select Allow for your device to access the Microphone and Camera for your visit. You will then be connected, and your provider will be with you shortly.  **If they have any issues connecting, or need assistance please contact MyChart service desk [phone])**  **If using a computer, in order to ensure the best quality for their visit they will need to use either of the following Internet Browsers: [employer], or  Google Chrome**  IF USING DOXIMITY or DOXY.ME - The patient will receive a link just prior to their visit by text.     FULL LENGTH CONSENT FOR TELE-HEALTH VISIT   I hereby voluntarily request, consent and authorize [place] and its employed or contracted physicians, physician assistants, nurse practitioners or other licensed health care professionals (the Practitioner), to provide me with telemedicine health care services (the "Services") as deemed necessary by the treating Practitioner. I acknowledge and consent to receive the Services by the Practitioner via telemedicine. I understand that the telemedicine visit will involve communicating with the Practitioner through live audiovisual communication technology and the disclosure of certain medical information by electronic transmission. I acknowledge that I have been given the opportunity to request an in-person assessment or other available alternative prior to the telemedicine visit and am voluntarily participating in the telemedicine visit.  I understand that I have the right to withhold or withdraw my consent to the use of telemedicine in the course of my care at any time, without affecting my right to future care or treatment, and that the Practitioner or I may terminate the telemedicine visit at any time. I understand that I have the right to inspect all information obtained and/or recorded in the course of the telemedicine visit and may receive copies of available information for a reasonable fee.  I understand that some of the potential risks of receiving the Services via telemedicine include:  Marland Kitchen Delay or interruption in medical evaluation due to technological equipment failure or disruption; . Information transmitted may not be sufficient (e.g. poor resolution of images) to allow for appropriate medical decision making by the Practitioner; and/or  . In rare instances, security protocols could fail, causing a breach of personal health  information.  Furthermore, I acknowledge that it is my responsibility to provide information about my medical history, conditions and care that is complete and accurate to the best of my ability. I acknowledge that Practitioner's advice, recommendations, and/or decision may be based on factors not within their control, such as incomplete or inaccurate data provided by me or distortions of diagnostic images or specimens that may result from electronic transmissions. I understand that the practice of medicine is not an exact science and that Practitioner makes no warranties or guarantees regarding treatment outcomes. I acknowledge that I will receive a copy of this consent concurrently upon execution via email to the email address I last provided but may also request a printed copy by calling the office of [place].    I understand that my insurance will be billed for this visit.   I have read or had this consent read to me. . I understand the contents of this consent, which adequately explains the benefits and risks of the Services being provided via telemedicine.  . I have been provided ample opportunity to ask questions regarding this consent and the Services and have had my questions answered to my satisfaction. . I give my informed consent for  the services to be provided through the use of telemedicine in my medical care  By participating in this telemedicine visit I agree to the above.

## 2019-01-21 ENCOUNTER — Ambulatory Visit: Payer: Non-veteran care | Admitting: Adult Health

## 2019-01-27 ENCOUNTER — Telehealth: Payer: Self-pay | Admitting: Cardiology

## 2019-01-27 NOTE — Telephone Encounter (Signed)
Smartphone/ my chart/ consent/ pre reg completed °

## 2019-01-27 NOTE — Progress Notes (Signed)
Virtual Visit via Video Note   This visit type was conducted due to national recommendations for restrictions regarding the COVID-19 Pandemic (e.g. social distancing) in an effort to limit this patient's exposure and mitigate transmission in our community.  Due to his co-morbid illnesses, this patient is at least at moderate risk for complications without adequate follow up.  This format is felt to be most appropriate for this patient at this time.  All issues noted in this document were discussed and addressed.  A limited physical exam was performed with this format.  Please refer to the patient's chart for his consent to telehealth for [facility].   Evaluation Performed:  Follow-up visit  Date:  01/28/2019   ID:  Harry Marsh, DOB 02/17/1956, MRN [id number]  Patient Location: Home Provider Location: Home  PCP:  Melburn PopperHarris, Elizabeth B, FNP  Cardiologist:  Rollene RotundaJames Brentney Goldbach, MD  Electrophysiologist:  None   Chief Complaint:  Cardiomyopathy  History of Present Illness:    Harry LittenRodney Marsh is a 63 y.o. male with who presents for follow up of dilated cardiomyopathy. I saw him in 2012 for evaluation of chest pain and dyspnea. He had a pericardial effusion window in the 1990s. He had a dilated cardiomyopathy while he was critically ill with sepsis. Follow-up echo however was normal.  In 2017 he had chest pain and a stress perfusion study. This demonstrated a medium-sized defect of mild severity in the basal inferior, mid inferior, apical inferior and apical lateral location. However, the ejection fraction was also reduced at 32%.  Echocardiogram confirmed the reduced ejection fraction of 25-30%. He did appear to have pericardial thickening as well.    Cardiac cath was done and he had minimal coronary disease.  He had a follow up echo in March with an EF of 40 - 45% with pericardial thickening without effusion.    He presents for follow up he is doing very well.  The patient denies any new symptoms  such as chest discomfort, neck or arm discomfort. There has been no new shortness of breath, PND or orthopnea. There have been no reported palpitations, presyncope or syncope. He walks about 3 miles per day.  He denies any shortness of breath.  He has no PND or orthopnea.  He denies any palpitations, presyncope or syncope.  He has a little edema chronically in his left leg where he has had surgery and a DVT.  He otherwise does not have any swelling in his legs or abdominal distention.  The patient does not have symptoms concerning for COVID-19 infection (fever, chills, cough, or new shortness of breath).    Past Medical History:  Diagnosis Date  . Abscess of liver(572.0)   . Arthritis, rheumatoid (HCC)   . Cardiomyopathy   . Cardiomyopathy (HCC) 01/19/2008   a.  assoc with sepsis >> resolved by echo in 2012 //  b. Myoview 6/17: EF 32%, inf, apical inf, apical lateral infarct vs diaph atten, inf-septal defect likely artifact, Intermediate Risk //  c.  Echo 6/17: EF 25-30%, diff HK, Gr 2 DD, dilated Ao root (39 mm) and ascending Aorta (40 mm), mild LAE, mild reduced RVSF, pericardium thickened, concern for pericardial constriction    . Diverticulitis   . DVT (deep venous thrombosis) (HCC)    provoked after abdominal surgery many years ago  . HTN (hypertension)   . Hypogonadism in male    prior Testosterone use  . NSVT (nonsustained ventricular tachycardia) (HCC) 03/06/2016   Holter 6/17:  SR/Sinus brady,  Freq PVCs (9.45%), NSVT (4-5 beats)  . Pericardial effusion    s/p pericardial window in 1990s  . Renal failure, acute (HCC)    assoc with sepsis in 2009  . Septic shock(785.52) 2009   hx of   Past Surgical History:  Procedure Laterality Date  . CARDIAC CATHETERIZATION N/A 03/08/2016   Procedure: Left Heart Cath and Coronary Angiography;  Surgeon: Corky Crafts, MD;  Location: [facility] INVASIVE CV LAB;  Service: Cardiovascular;  Laterality: N/A;  . CHOLECYSTECTOMY    . KNEE SURGERY     Left   . PERICARDIAL WINDOW       Current Meds  Medication Sig  . aspirin EC 81 MG tablet Take 1 tablet (81 mg total) by mouth daily.  . carvedilol (COREG) 3.125 MG tablet Take 1 tablet (3.125 mg total) by mouth 2 (two) times daily.  Marland Kitchen etanercept (ENBREL SURECLICK) 50 MG/ML injection Inject 50 mg into the skin once a week. Thursday  . folic acid (FOLVITE) 1 MG tablet Take 1 mg by mouth daily.  Marland Kitchen HYDROcodone-acetaminophen (NORCO/VICODIN) 5-325 MG tablet Take 1 tablet by mouth 2 (two) times daily as needed for moderate pain or severe pain.   Marland Kitchen lisinopril (PRINIVIL,ZESTRIL) 10 MG tablet Take 10 mg by mouth daily.  . metFORMIN (GLUCOPHAGE) 500 MG tablet Take 500 mg by mouth 2 (two) times daily with a meal.  . methotrexate (RHEUMATREX) 2.5 MG tablet Take 20 mg by mouth once a week. Thursday  . Multiple Vitamin (MULTIVITAMIN) tablet Take 1 tablet by mouth daily.    . naproxen sodium (ALEVE) 220 MG tablet Take 440 mg by mouth 2 (two) times daily.  . Omega-3 Fatty Acids (FISH OIL) 1000 MG CAPS Take 3 capsules by mouth daily.    . predniSONE (DELTASONE) 2.5 MG tablet Take 2.5 mg by mouth daily with breakfast.  . Testosterone (ANDROGEL PUMP) 20.25 MG/ACT (1.62%) GEL Place 1 Pump onto the skin daily.     Allergies:   Latex   Social History   Tobacco Use  . Smoking status: Former Smoker    Last attempt to quit: 09/30/1996    Years since quitting: 22.3  . Smokeless tobacco: Never Used  Substance Use Topics  . Alcohol use: Yes    Alcohol/week: 0.0 standard drinks    Comment: occasional  . Drug use: No     Family Hx: The patient's family history includes Arthritis in his father. There is no history of Heart attack.  ROS:   Please see the history of present illness.    Positive for back pain, erectile dysfunction All other systems reviewed and are negative.   Prior CV studies:   The following studies were reviewed today:  Echo  Labs/Other Tests and Data Reviewed:    EKG:  No ECG  reviewed.  Recent Labs: No results found for requested labs within last 8760 hours.   Recent Lipid Panel No results found for: CHOL, TRIG, HDL, CHOLHDL, LDLCALC, LDLDIRECT  Wt Readings from Last 3 Encounters:  01/28/19 220 lb (99.8 kg)  11/17/18 228 lb (103.4 kg)  03/08/16 267 lb (121.1 kg)     Objective:    Vital Signs:  BP 127/82   Pulse 78   Ht 6\' 1"  (1.854 m)   Wt 220 lb (99.8 kg)   BMI 29.03 kg/m    VITAL SIGNS:  reviewed GEN:  no acute distress EYES:  sclerae anicteric, EOMI - Extraocular Movements Intact NEURO:  alert and oriented x 3, no obvious  focal deficit PSYCH:  normal affect  ASSESSMENT & PLAN:    Chest pain -    The patient has no chest discomfort.  He had normal coronaries on cath in 2017.  No change in therapy.   Chronic systolic HF -    His EF is slightly increased.  He has absolutely no symptoms.  No change in therapy.  HTN -    This is being managed in the context of treating his CHF  Pericardial Effusion -       This has been very small and chronic.  He has pericardial thickening and echocardiographic suggestion of constriction but no symptoms related to this.  At this point this has not changed over the years.  He is asymptomatic and no change in therapy or further testing is indicated but I will follow him again clinically in 1 year.  ED - I have agreed to give him Cialis for erectile dysfunction  COVID-19 Education: The signs and symptoms of COVID-19 were discussed with the patient and how to seek care for testing (follow up with PCP or arrange E-visit).  The importance of social distancing was discussed today.  Time:   Today, I have spent 25 minutes with the patient with telehealth technology discussing the above problems.     Medication Adjustments/Labs and Tests Ordered: Current medicines are reviewed at length with the patient today.  Concerns regarding medicines are outlined above.   Tests Ordered: No orders of the defined types were  placed in this encounter.   Medication Changes: No orders of the defined types were placed in this encounter.   Disposition:  Follow up one year  Signed, Rollene Rotunda, MD  01/28/2019 9:17 AM    [facility]

## 2019-01-28 ENCOUNTER — Telehealth (INDEPENDENT_AMBULATORY_CARE_PROVIDER_SITE_OTHER): Payer: No Typology Code available for payment source | Admitting: Cardiology

## 2019-01-28 ENCOUNTER — Encounter: Payer: Self-pay | Admitting: Cardiology

## 2019-01-28 VITALS — BP 127/82 | HR 78 | Ht 73.0 in | Wt 220.0 lb

## 2019-01-28 DIAGNOSIS — I420 Dilated cardiomyopathy: Secondary | ICD-10-CM

## 2019-01-28 DIAGNOSIS — I311 Chronic constrictive pericarditis: Secondary | ICD-10-CM | POA: Diagnosis not present

## 2019-01-28 DIAGNOSIS — Z7189 Other specified counseling: Secondary | ICD-10-CM | POA: Diagnosis not present

## 2019-01-28 NOTE — Patient Instructions (Signed)

## 2019-02-11 NOTE — Telephone Encounter (Signed)
Patient called in stating that his Cialis had not arrived in the mail yet. Per office note you agreed to give him a prescription of Cialis. We can put the prescription in, but what dosage of Cialis would you like him to have?

## 2019-02-19 MED ORDER — TADALAFIL 20 MG PO TABS: 20.0000 mg | ORAL_TABLET | Freq: Every day | ORAL | 2 refills | Status: DC | PRN

## 2020-01-13 ENCOUNTER — Telehealth: Payer: Self-pay | Admitting: Cardiology

## 2020-01-13 MED ORDER — TADALAFIL 20 MG PO TABS: 20.0000 mg | ORAL_TABLET | Freq: Every day | ORAL | 0 refills | Status: DC | PRN

## 2020-01-13 NOTE — Telephone Encounter (Signed)
New Rx fill with instruction to contact office for OV.

## 2020-01-13 NOTE — Telephone Encounter (Signed)
He can have 10 pills with no refills and needs to see me to get further refills.  Thanks.

## 2020-01-13 NOTE — Telephone Encounter (Signed)
*  STAT* If patient is at the pharmacy, call can be transferred to refill team.   1. Which medications need to be refilled? (please list name of each medication and dose if known) Cialis  2. Which pharmacy/location (including street and city if local pharmacy) is medication to be sent to?  [facility], [place]-  3. Do they need a 30 day or 90 day supply? 30 days- Pt is trying to get an appt with Dr Antoine Poche- waiting for his authorization from the [facility]

## 2020-03-17 ENCOUNTER — Ambulatory Visit: Payer: No Typology Code available for payment source | Admitting: Cardiology

## 2020-03-30 DIAGNOSIS — I5022 Chronic systolic (congestive) heart failure: Secondary | ICD-10-CM | POA: Insufficient documentation

## 2020-03-30 DIAGNOSIS — Z7189 Other specified counseling: Secondary | ICD-10-CM | POA: Insufficient documentation

## 2020-03-30 NOTE — Progress Notes (Signed)
Cardiology Office Note   Date:  03/31/2020   ID:  Harry Marsh, DOB 02-02-1956, MRN [id number]  PCP:  Melburn Popper, FNP  Cardiologist:   Rollene Rotunda, MD   Chief Complaint  Patient presents with  . Leg Pain      History of Present Illness: Harry Marsh is a 64 y.o. male who presents for follow up of dilated cardiomyopathy. I saw him in 2012 for evaluation of chest pain and dyspnea. He had a pericardial effusion window in the 1990s. He had a dilated cardiomyopathy while he was critically ill with sepsis. Follow-up echo however was normal.  In 2017 he had chest pain and a stress perfusion study. This demonstrated a medium-sized defect of mild severity in the basal inferior, mid inferior, apical inferior and apical lateral location. However, the ejection fraction was also reduced at 32%.  Echocardiogram confirmed the reduced ejection fraction of 25-30%. He did appear to have pericardial thickening as well.    Cardiac cath was done and he had minimal coronary disease.  He had a follow up echo in March with an EF of 40 - 45% with pericardial thickening without effusion.    He presents for follow up .  He has had no acute cardiac complaints.  He is not having any new shortness of breath, PND or orthopnea.  He is not having any new palpitations, presyncope or syncope.  He walks his [place] for an hour every day.  With this he does not have any new shortness of breath, PND or orthopnea.  He does have a palpitations, presyncope or syncope.  Unfortunately he is right now being treated for venous thrombosis and is on anticoagulation.  He also has had a nonhealing ulcer on his toe and what sounds like osteomyelitis.  He is also getting PT for a frozen shoulder.  All of this is happening at the [place].  He may or may not need to have surgery on his toe.  Past Medical History:  Diagnosis Date  . Abscess of liver(572.0)   . Arthritis, rheumatoid (HCC)   . Cardiomyopathy   .  Cardiomyopathy (HCC) 01/19/2008   a.  assoc with sepsis >> resolved by echo in 2012 //  b. Myoview 6/17: EF 32%, inf, apical inf, apical lateral infarct vs diaph atten, inf-septal defect likely artifact, Intermediate Risk //  c.  Echo 6/17: EF 25-30%, diff HK, Gr 2 DD, dilated Ao root (39 mm) and ascending Aorta (40 mm), mild LAE, mild reduced RVSF, pericardium thickened, concern for pericardial constriction    . Diverticulitis   . DVT (deep venous thrombosis) (HCC)    provoked after abdominal surgery many years ago  . HTN (hypertension)   . Hypogonadism in male    prior Testosterone use  . NSVT (nonsustained ventricular tachycardia) (HCC) 03/06/2016   Holter 6/17:  SR/Sinus brady, Freq PVCs (9.45%), NSVT (4-5 beats)  . Pericardial effusion    s/p pericardial window in 1990s  . Renal failure, acute (HCC)    assoc with sepsis in 2009  . Septic shock(785.52) 2009   hx of    Past Surgical History:  Procedure Laterality Date  . CARDIAC CATHETERIZATION N/A 03/08/2016   Procedure: Left Heart Cath and Coronary Angiography;  Surgeon: Corky Crafts, MD;  Location: [facility] INVASIVE CV LAB;  Service: Cardiovascular;  Laterality: N/A;  . CHOLECYSTECTOMY    . KNEE SURGERY     Left  . PERICARDIAL WINDOW  Current Outpatient Medications  Medication Sig Dispense Refill  . carvedilol (COREG) 3.125 MG tablet Take 1 tablet (3.125 mg total) by mouth 2 (two) times daily. 180 tablet 3  . ciprofloxacin (CIPRO) 500 MG tablet Take 500 mg by mouth daily.    . clindamycin (CLEOCIN) 150 MG capsule Take 150 mg by mouth 3 (three) times daily.    . folic acid (FOLVITE) 1 MG tablet Take 1 mg by mouth daily.    Marland Kitchen HYDROcodone-acetaminophen (NORCO/VICODIN) 5-325 MG tablet Take 1 tablet by mouth 2 (two) times daily as needed for moderate pain or severe pain.   0  . lisinopril (PRINIVIL,ZESTRIL) 10 MG tablet Take 10 mg by mouth daily.    . metFORMIN (GLUCOPHAGE) 500 MG tablet Take 500 mg by mouth 2 (two) times daily  with a meal.    . Omega-3 Fatty Acids (FISH OIL) 1000 MG CAPS Take 3 capsules by mouth daily.      . predniSONE (DELTASONE) 5 MG tablet Take 5 mg by mouth daily with breakfast.     . Testosterone (ANDROGEL PUMP) 20.25 MG/ACT (1.62%) GEL Place 1 Pump onto the skin daily.    Marland Kitchen aspirin EC 81 MG tablet Take 1 tablet (81 mg total) by mouth daily. (Patient not taking: Reported on 03/31/2020)    . etanercept (ENBREL SURECLICK) 50 MG/ML injection Inject 50 mg into the skin once a week. Thursday (Patient not taking: Reported on 03/31/2020)    . methotrexate (RHEUMATREX) 2.5 MG tablet Take 20 mg by mouth once a week. Thursday (Patient not taking: Reported on 03/31/2020)    . Multiple Vitamin (MULTIVITAMIN) tablet Take 1 tablet by mouth daily.   (Patient not taking: Reported on 03/31/2020)    . naproxen sodium (ALEVE) 220 MG tablet Take 440 mg by mouth 2 (two) times daily. (Patient not taking: Reported on 03/31/2020)    . sildenafil (VIAGRA) 50 MG tablet 1 tablet by mouth daily as needed 10 tablet 2   No current facility-administered medications for this visit.    Allergies:   Latex    ROS:  Please see the history of present illness.   Otherwise, review of systems are positive for none.   All other systems are reviewed and negative.    PHYSICAL EXAM: VS:  BP (!) 148/88   Pulse 68   Ht 6\' 1"  (1.854 m)   Wt 225 lb 9.6 oz (102.3 kg)   SpO2 96%   BMI 29.76 kg/m  , BMI Body mass index is 29.76 kg/m. GENERAL:  Well appearing NECK:  No jugular venous distention, waveform within normal limits, carotid upstroke brisk and symmetric, no bruits, no thyromegaly LUNGS:  Clear to auscultation bilaterally CHEST:  Unremarkable HEART:  PMI not displaced or sustained,S1 and S2 within normal limits, no S3, no S4, no clicks, no rubs, no murmurs ABD:  Flat, positive bowel sounds normal in frequency in pitch, no bruits, no rebound, no guarding, no midline pulsatile mass, no hepatomegaly, no splenomegaly EXT:  2 plus pulses  throughout, no edema, no cyanosis no clubbing, arthritic changes.      EKG:  EKG is ordered today. The ekg ordered today demonstrates sinus rhythm, rate 68, axis within normal limits, intervals within normal limits, premature ectopic complexes, poor anterior R wave progression, no acute ST-T wave changes.   Recent Labs: No results found for requested labs within last 8760 hours.    Lipid Panel No results found for: CHOL, TRIG, HDL, CHOLHDL, VLDL, LDLCALC, LDLDIRECT  Wt Readings from Last 3 Encounters:  03/31/20 225 lb 9.6 oz (102.3 kg)  01/28/19 220 lb (99.8 kg)  11/17/18 228 lb (103.4 kg)      Other studies Reviewed: Additional studies/ records that were reviewed today include: None. Review of the above records demonstrates:  Please see elsewhere in the note.     ASSESSMENT AND PLAN:   Chronic systolic HF-   The patient's ejection fraction had improved as above.  He has no symptoms consistent with heart failure.  At this point I do not think that further imaging with echocardiography is indicated.  We talked about symptoms to keep an eye out for.  HTN -   The blood pressure is well controlled.  No change in therapy.   Pericardial Effusion -   This has been chronic.he denies any new symptoms.  His exam is unremarkable.  There is some constrictive physiology on echo but I would not suspect this clinically to be worse.  I will follow him again clinically in 1 year.    ED -   I have taken the liberty of giving him Viagra.  DYSLIPIDEMIA:  I will send him for fasting lipid profile.  Current medicines are reviewed at length with the patient today.  The patient does not have concerns regarding medicines.  The following changes have been made:  As above  Labs/ tests ordered today include: NA  Orders Placed This Encounter  Procedures  . Lipid panel  . EKG 12-Lead     Disposition:   FU with me in one year.     Signed, Rollene Rotunda, MD  03/31/2020 2:54 PM      [facility]

## 2020-03-31 ENCOUNTER — Other Ambulatory Visit: Payer: Self-pay

## 2020-03-31 ENCOUNTER — Ambulatory Visit (INDEPENDENT_AMBULATORY_CARE_PROVIDER_SITE_OTHER): Payer: Medicaid - Out of State | Admitting: Cardiology

## 2020-03-31 ENCOUNTER — Encounter: Payer: Self-pay | Admitting: Cardiology

## 2020-03-31 VITALS — BP 148/88 | HR 68 | Ht 73.0 in | Wt 225.6 lb

## 2020-03-31 DIAGNOSIS — I3139 Other pericardial effusion (noninflammatory): Secondary | ICD-10-CM

## 2020-03-31 DIAGNOSIS — I313 Pericardial effusion (noninflammatory): Secondary | ICD-10-CM | POA: Diagnosis not present

## 2020-03-31 DIAGNOSIS — I10 Essential (primary) hypertension: Secondary | ICD-10-CM | POA: Diagnosis not present

## 2020-03-31 DIAGNOSIS — R072 Precordial pain: Secondary | ICD-10-CM

## 2020-03-31 DIAGNOSIS — Z7189 Other specified counseling: Secondary | ICD-10-CM

## 2020-03-31 DIAGNOSIS — I5022 Chronic systolic (congestive) heart failure: Secondary | ICD-10-CM

## 2020-03-31 MED ORDER — SILDENAFIL CITRATE 50 MG PO TABS: ORAL_TABLET | ORAL | 2 refills | Status: DC

## 2020-03-31 NOTE — Patient Instructions (Addendum)
Medication Instructions:  START SILDENAFIL 50 MG AS NEEDED   *If you need a refill on your cardiac medications before your next appointment, please call your pharmacy*  Lab Work: FASTING LP SOON   If you have labs (blood work) drawn today and your tests are completely normal, you will receive your results only by: Marland Kitchen MyChart Message (if you have MyChart) OR . A paper copy in the mail If you have any lab test that is abnormal or we need to change your treatment, we will call you to review the results.  Testing/Procedures: NONE   Follow-Up: At [facility], you and your health needs are our priority.  As part of our continuing mission to provide you with exceptional heart care, we have created designated Provider Care Teams.  These Care Teams include your primary Cardiologist (physician) and Advanced Practice Providers (APPs -  Physician Assistants and Nurse Practitioners) who all work together to provide you with the care you need, when you need it.  We recommend signing up for the patient portal called "MyChart".  Sign up information is provided on this After Visit Summary.  MyChart is used to connect with patients for Virtual Visits (Telemedicine).  Patients are able to view lab/test results, encounter notes, upcoming appointments, etc.  Non-urgent messages can be sent to your provider as well.   To learn more about what you can do with MyChart, go to [email or web address].    Your next appointment:   12 month(s)  You will receive a reminder letter in the mail two months in advance. If you don't receive a letter, please call our office to schedule the follow-up appointment.  The format for your next appointment:   In Person  Provider:   You may see Rollene Rotunda, MD or one of the following Advanced Practice Providers on your designated Care Team:    Theodore Demark, PA-C  Joni Reining, DNP, ANP  Cadence Fransico Michael, NP

## 2021-01-11 ENCOUNTER — Other Ambulatory Visit: Payer: Self-pay | Admitting: Cardiology
